# Patient Record
Sex: Female | Born: 1975
Health system: Southern US, Community
[De-identification: ages and names within clinical notes are randomized; demographics above are authoritative.]

## PROBLEM LIST (undated history)

## (undated) DIAGNOSIS — E785 Hyperlipidemia, unspecified: Secondary | ICD-10-CM

## (undated) DIAGNOSIS — Z87442 Personal history of urinary calculi: Secondary | ICD-10-CM

## (undated) DIAGNOSIS — M7710 Lateral epicondylitis, unspecified elbow: Secondary | ICD-10-CM

## (undated) DIAGNOSIS — F419 Anxiety disorder, unspecified: Secondary | ICD-10-CM

## (undated) DIAGNOSIS — T7840XA Allergy, unspecified, initial encounter: Secondary | ICD-10-CM

## (undated) DIAGNOSIS — F329 Major depressive disorder, single episode, unspecified: Secondary | ICD-10-CM

## (undated) DIAGNOSIS — F32A Depression, unspecified: Secondary | ICD-10-CM

## (undated) HISTORY — DX: Major depressive disorder, single episode, unspecified: F32.9

## (undated) HISTORY — PX: KIDNEY STONE SURGERY: SHX686

## (undated) HISTORY — DX: Anxiety disorder, unspecified: F41.9

## (undated) HISTORY — DX: Lateral epicondylitis, unspecified elbow: M77.10

## (undated) HISTORY — DX: Allergy, unspecified, initial encounter: T78.40XA

## (undated) HISTORY — DX: Depression, unspecified: F32.A

## (undated) HISTORY — PX: WISDOM TOOTH EXTRACTION: SHX21

## (undated) HISTORY — DX: Hyperlipidemia, unspecified: E78.5

---

## 2015-06-11 HISTORY — PX: LITHOTRIPSY: SUR834

## 2016-10-30 ENCOUNTER — Ambulatory Visit (INDEPENDENT_AMBULATORY_CARE_PROVIDER_SITE_OTHER): Payer: Self-pay | Admitting: Family Medicine

## 2016-10-30 ENCOUNTER — Encounter: Payer: Self-pay | Admitting: Family Medicine

## 2016-10-30 VITALS — BP 140/75 | HR 110 | Temp 99.4°F | Resp 16 | Ht 62.0 in | Wt 182.2 lb

## 2016-10-30 DIAGNOSIS — J0100 Acute maxillary sinusitis, unspecified: Secondary | ICD-10-CM

## 2016-10-30 MED ORDER — HYDROCODONE-HOMATROPINE 5-1.5 MG/5ML PO SYRP: 5.0000 mL | ORAL_SOLUTION | Freq: Three times a day (TID) | ORAL | 0 refills | Status: DC | PRN

## 2016-10-30 MED ORDER — PREDNISONE 20 MG PO TABS: ORAL_TABLET | ORAL | 0 refills | Status: DC

## 2016-10-30 MED ORDER — AMOXICILLIN 500 MG PO CAPS: 1000.0000 mg | ORAL_CAPSULE | Freq: Two times a day (BID) | ORAL | 0 refills | Status: DC

## 2016-10-30 NOTE — Patient Instructions (Signed)

## 2016-10-30 NOTE — Progress Notes (Signed)
Subjective:  By signing my name below, I, Lori Fisher, attest that this documentation has been prepared under the direction and in the presence of [name], MD Electronically Signed: Ladene Artist, ED Scribe 10/30/2016 at 9:37 AM.   Patient ID: Lori Fisher, female    DOB: Mar 20, 1976, 41 y.o.   MRN: [id number]  Chief Complaint  Patient presents with  . Sinus Problem    nasal congestion, facial pressure  . Cough    Productive, x 2 days   . Ear Pain    Bilateral    HPI Lori Fisher is a 41 y.o. female who presents to Primary Care at [facility] complaining of a sore, scratchy throat onset 5 days ago. Pt reports associated symptoms of fever with Tmax 99 F, chills, nasal congestion, sinus pressure/pain, postnasal drip, pressure in both ears, productive cough with yellow sputum. Pt has tried TheraFlu, Mucinex, Flonase and has increased her water intake. She states that she recently moved her for work; but states that she used to take allergy injections. No antibiotic allergies.   Past Medical History:  Diagnosis Date  . Allergy   . Anxiety   . Depression   . Hyperlipidemia    No current outpatient prescriptions on file prior to visit.   No current facility-administered medications on file prior to visit.    No Known Allergies  Review of Systems  Constitutional: Positive for chills and fever.  HENT: Positive for congestion, ear pain (pressure), postnasal drip, sinus pain, sinus pressure and sore throat.   Respiratory: Positive for cough.   Allergic/Immunologic: Positive for environmental allergies.      Objective:   Physical Exam  Constitutional: She is oriented to person, place, and time. She appears well-developed and well-nourished. No distress.  HENT:  Head: Normocephalic and atraumatic.  Right Ear: A middle ear effusion is present.  Left Ear: A middle ear effusion is present.  Nose: Right sinus exhibits maxillary sinus tenderness. Left sinus exhibits maxillary sinus  tenderness.  Eyes: Conjunctivae and EOM are normal.  Neck: Neck supple. No tracheal deviation present.  Cardiovascular: Regular rhythm, S1 normal, S2 normal and normal heart sounds.  Tachycardia present.   Pulmonary/Chest: Effort normal and breath sounds normal. No respiratory distress.  Musculoskeletal: Normal range of motion.  Lymphadenopathy:       Head (right side): Submandibular adenopathy present.       Head (left side): Submandibular adenopathy present.    She has cervical adenopathy (anterior).  Neurological: She is alert and oriented to person, place, and time.  Skin: Skin is warm. She is diaphoretic.  Psychiatric: She has a normal mood and affect. Her behavior is normal.  Nursing note and vitals reviewed.  BP 140/75   Pulse (!) 110   Temp 99.4 F (37.4 C) (Oral)   Resp 16   Ht 5\' 2"  (1.575 m)   Wt 182 lb 3.2 oz (82.6 kg)   SpO2 97%   BMI 33.32 kg/m     Assessment & Plan:   1. Acute non-recurrent maxillary sinusitis   pt requested out of work note  Meds ordered this encounter  Medications  . sertraline (ZOLOFT) 50 MG tablet    Sig: Take 50 mg by mouth daily.  Marland Kitchen levocetirizine (XYZAL) 5 MG tablet    Sig: Take 5 mg by mouth every evening.  . Probiotic Product (PROBIOTIC-10 PO)    Sig: Take by mouth.  . fluticasone (FLONASE) 50 MCG/ACT nasal spray    Sig: Place into both nostrils  daily.  . amoxicillin (AMOXIL) 500 MG capsule    Sig: Take 2 capsules (1,000 mg total) by mouth 2 (two) times daily.    Dispense:  40 capsule    Refill:  0  . predniSONE (DELTASONE) 20 MG tablet    Sig: 3 tabs x 2d, 2 tabs x 2d, 1 tab x 2d    Dispense:  12 tablet    Refill:  0  . HYDROcodone-homatropine (HYCODAN) 5-1.5 MG/5ML syrup    Sig: Take 5 mLs by mouth every 8 (eight) hours as needed for cough.    Dispense:  100 mL    Refill:  0    I personally performed the services described in this documentation, which was scribed in my presence. The recorded information has been  reviewed and considered, and addended by me as needed.   [name], M.D.  Primary Care at [facility] [address] [place], [place] [address] [phone] phone [phone] fax  11/05/16 1:27 PM

## 2016-10-31 ENCOUNTER — Telehealth: Payer: Self-pay | Admitting: Family Medicine

## 2016-10-31 NOTE — Telephone Encounter (Signed)
Pt was just seen yesterday so would have been received today so no, I have definitely have not seen it. Also, I am out of the office until Thursday 5/31 so it will be a while before I am going to be able to get this completed - I did not know I was going to have do paperwork for her.

## 2016-10-31 NOTE — Telephone Encounter (Signed)
Pt is calling to check and see if we had received her paperwork for temporary leave from work for [employer].  Please advise if you have seen it.  Advised that it could be with [name] or FMLA/disabilities [phone]

## 2016-11-01 NOTE — Telephone Encounter (Signed)
Still not in my box yet.

## 2016-11-05 NOTE — Telephone Encounter (Signed)
Paperwork was in my box I was out of town on vacation, I have completed what I could from the [place] notes and highlighted what needs to be finished I will place the forms in Dr Raul Del box on 11/05/16 please return them to the FMLA/Disability box at the 102 checkout desk within 5-7 business days. Thank you!

## 2016-11-09 ENCOUNTER — Ambulatory Visit (INDEPENDENT_AMBULATORY_CARE_PROVIDER_SITE_OTHER): Payer: 59 | Admitting: Family Medicine

## 2016-11-09 ENCOUNTER — Encounter: Payer: Self-pay | Admitting: Family Medicine

## 2016-11-09 VITALS — BP 123/82 | HR 116 | Temp 98.8°F | Resp 17 | Ht 62.25 in | Wt 179.0 lb

## 2016-11-09 DIAGNOSIS — J3089 Other allergic rhinitis: Secondary | ICD-10-CM | POA: Diagnosis not present

## 2016-11-09 DIAGNOSIS — F411 Generalized anxiety disorder: Secondary | ICD-10-CM

## 2016-11-09 MED ORDER — MONTELUKAST SODIUM 10 MG PO TABS: 10.0000 mg | ORAL_TABLET | Freq: Every day | ORAL | 3 refills | Status: DC

## 2016-11-09 MED ORDER — SERTRALINE HCL 50 MG PO TABS: 50.0000 mg | ORAL_TABLET | Freq: Every day | ORAL | 3 refills | Status: DC

## 2016-11-09 NOTE — Progress Notes (Signed)
By signing my name below, I, Mesha Guinyard, attest that this documentation has been prepared under the direction and in the presence of [name], MD.  Electronically Signed: Verlee Monte, Medical Scribe. 11/09/16. 10:06 AM.  Subjective:    Patient ID: Lori Fisher, female    DOB: [birth year], 41 y.o.   MRN: [id number]  HPI Chief Complaint  Patient presents with  . paperwork completion    Was out of work for a period of time due to sickness after last visit & job is requiring a visit & fom completion  . Medication Refill    Zoloft    HPI Comments: Lori Fisher is a 41 y.o. female who presents to Primary Care at [facility] for [facility] paper work and medication refill. She reports feeling much better after being out from 5/21 to 5/29. Pt went back to work 5/29 and she plans on being back full-time without anticipation of getting sick again. Pt is back on zxyzal and flonase. Pt isn't sure if she should continue zxyzal. Pt started geting bad sinus infections when she stopped getting her allergy injections after moving here - controlled when getting injections in the [place]. When pt was living up [place], she tested a positive allergy to pet dander, and some mold, but doesn't remember being allergic to pollen. Denies needing accommodations going back.  Pt would like to have her paperwork faxed out.  Zoloft Refill: She has been on this same dose for years, and hasn't been on other antidepressants. She has tried to wean herself down to 25 mg but her anxiety, in the form of being always worried, returned. She would worried about life, and work, but noting traumatic.  There are no active problems to display for this patient.  Past Medical History:  Diagnosis Date  . Allergy   . Anxiety   . Depression   . Hyperlipidemia    No past surgical history on file. No Known Allergies Prior to Admission medications   Medication Sig Start Date End Date Taking? Authorizing Provider  fluticasone (FLONASE) 50  MCG/ACT nasal spray Place into both nostrils daily.   Yes [provider]  levocetirizine (XYZAL) 5 MG tablet Take 5 mg by mouth every evening.   Yes [provider]  Probiotic Product (PROBIOTIC-10 PO) Take by mouth.   Yes [provider]  sertraline (ZOLOFT) 50 MG tablet Take 50 mg by mouth daily.   Yes [provider]   Social History   Social History  . Marital status: Married    Spouse name: N/A  . Number of children: N/A  . Years of education: N/A   Occupational History  . Not on file.   Social History Main Topics  . Smoking status: Never Smoker  . Smokeless tobacco: Never Used  . Alcohol use 0.6 oz/week    1 Glasses of wine per week  . Drug use: No  . Sexual activity: Not on file   Other Topics Concern  . Not on file   Social History Narrative  . No narrative on file   Review of Systems  Constitutional: Negative for fever.  HENT: Negative for congestion, sinus pain, sinus pressure and sore throat.   Respiratory: Negative for cough.   Allergic/Immunologic: Positive for environmental allergies.  Psychiatric/Behavioral: The patient is nervous/anxious (controlled with zoloft).     Objective:  Physical Exam  Constitutional: She appears well-developed and well-nourished. No distress.  HENT:  Head: Normocephalic and atraumatic.  Eyes: Conjunctivae are normal.  Neck:  Neck supple.  Cardiovascular: Normal rate.   Pulmonary/Chest: Effort normal.  Neurological: She is alert.  Skin: Skin is warm and dry.  Psychiatric: She has a normal mood and affect. Her behavior is normal.  Nursing note and vitals reviewed.   Vitals:   11/09/16 0941  BP: 123/82  Pulse: (!) 116  Resp: 17  Temp: 98.8 F (37.1 C)  TempSrc: Oral  SpO2: 95%  Weight: 179 lb (81.2 kg)  Height: 5' 2.25" (1.581 m)  Body mass index is 32.48 kg/m.  Assessment & Plan:   1. Environmental and seasonal allergies   2. Generalized anxiety disorder     Orders Placed  This Encounter  Procedures  . Ambulatory referral to Allergy    Referral Priority:   Routine    Referral Type:   Allergy Testing    Referral Reason:   Specialty Services Required    Requested Specialty:   Allergy    Number of Visits Requested:   1    Meds ordered this encounter  Medications  . sertraline (ZOLOFT) 50 MG tablet    Sig: Take 1 tablet (50 mg total) by mouth daily.    Dispense:  90 tablet    Refill:  3  . montelukast (SINGULAIR) 10 MG tablet    Sig: Take 1 tablet (10 mg total) by mouth at bedtime.    Dispense:  30 tablet    Refill:  3    I personally performed the services described in this documentation, which was scribed in my presence. The recorded information has been reviewed and considered, and addended by me as needed.   [name], M.D.  Primary Care at [facility] [address] [place], [place] [address] [phone] phone [phone] fax  11/11/16 11:02 PM

## 2016-11-09 NOTE — Patient Instructions (Signed)
     IF you received an x-ray today, you will receive an invoice from [facility]. Please contact [facility] at [phone] with questions or concerns regarding your invoice.   IF you received labwork today, you will receive an invoice from [facility]. Please contact [facility] at [phone] with questions or concerns regarding your invoice.   Our billing staff will not be able to assist you with questions regarding bills from these companies.  You will be contacted with the lab results as soon as they are available. The fastest way to get your results is to activate your My Chart account. Instructions are located on the last page of this paperwork. If you have not heard from us regarding the results in 2 weeks, please contact this office.     

## 2016-11-09 NOTE — Telephone Encounter (Signed)
Done today during OV. Copies returned to pt in office with originals faxed and to [facility] box. Pt had two more forms about the same thing for completion that are with it and done. Do not need to charge pt for forms since she came in for an OV just to have these done so can use the OV bill to cover it.  Thanks.

## 2016-11-11 MED FILL — SERTRALINE HCL 50 MG TABLET: 50 | 90 days supply | Qty: 90 | Fill #0

## 2016-11-12 NOTE — Telephone Encounter (Signed)
Paperwork scanned and faxed on 11/12/16

## 2017-01-07 MED FILL — MONTELUKAST SOD 10 MG TAB: 10 | 30 days supply | Qty: 30 | Fill #0

## 2017-02-03 MED FILL — MONTELUKAST SOD 10 MG TAB: 10 | 30 days supply | Qty: 30 | Fill #1

## 2017-02-24 MED FILL — SERTRALINE HCL 50 MG TABLET: 50 | 90 days supply | Qty: 90 | Fill #1

## 2017-03-07 ENCOUNTER — Other Ambulatory Visit: Payer: Self-pay | Admitting: Family Medicine

## 2017-03-07 MED FILL — MONTELUKAST SOD 10 MG TAB: 10 | 30 days supply | Qty: 30 | Fill #0

## 2017-03-14 DIAGNOSIS — Z6832 Body mass index (BMI) 32.0-32.9, adult: Secondary | ICD-10-CM

## 2017-03-14 DIAGNOSIS — F411 Generalized anxiety disorder: Secondary | ICD-10-CM | POA: Insufficient documentation

## 2017-03-14 DIAGNOSIS — J3089 Other allergic rhinitis: Secondary | ICD-10-CM | POA: Insufficient documentation

## 2017-03-14 DIAGNOSIS — E6609 Other obesity due to excess calories: Secondary | ICD-10-CM | POA: Insufficient documentation

## 2017-03-14 DIAGNOSIS — E7800 Pure hypercholesterolemia, unspecified: Secondary | ICD-10-CM | POA: Insufficient documentation

## 2017-03-14 NOTE — Progress Notes (Deleted)
   Subjective:    Patient ID: Margot Oriordan, female    DOB: May 13, 1976, 41 y.o.   MRN: [id number]  HPI  Primary Preventative Screenings: Cervical Cancer:  Family Planning: STI screening:  Breast Cancer:  Colorectal Cancer: Tobacco use/EtOH/substances: Bone Density: Cardiac: Weight/Blood sugar/Diet/Exercise: OTC/Vit/Supp/Herbal:probiotic Dentist/Optho: Immunizations:  UTD as just started working for [employer] so had to confirm.  Chronic Medical Conditions: Environmental allergies: flonase, xyzal, singulair. Prev was on allergy injections (was living in [place]). +pet dander, mold. Depression/anxiety: zoloft 50 - stable on same dose for years. No prior trials of other meds but sxs return when tries to wean off.  Past Medical History:  Diagnosis Date  . Allergy   . Anxiety   . Depression   . Hyperlipidemia    No past surgical history on file. Current Outpatient Prescriptions on File Prior to Visit  Medication Sig Dispense Refill  . fluticasone (FLONASE) 50 MCG/ACT nasal spray Place into both nostrils daily.    Marland Kitchen levocetirizine (XYZAL) 5 MG tablet Take 5 mg by mouth every evening.    . montelukast (SINGULAIR) 10 MG tablet TAKE 1 TABLET BY MOUTH ONCE DAILY AT BEDTIME 30 tablet 2  . Probiotic Product (PROBIOTIC-10 PO) Take by mouth.    . sertraline (ZOLOFT) 50 MG tablet Take 1 tablet (50 mg total) by mouth daily. 90 tablet 3   No current facility-administered medications on file prior to visit.    No Known Allergies Family History  Problem Relation Age of Onset  . Cancer Father   . Diabetes Sister    Social History   Social History  . Marital status: Married    Spouse name: N/A  . Number of children: N/A  . Years of education: N/A   Social History Main Topics  . Smoking status: Never Smoker  . Smokeless tobacco: Never Used  . Alcohol use 0.6 oz/week    1 Glasses of wine per week  . Drug use: No  . Sexual activity: Not on file   Other Topics Concern  . Not on file    Social History Narrative  . No narrative on file    Review of Systems     Objective:   Physical Exam        Assessment & Plan:  hiv? Pap? Flu? Tdap? TACHYCARDIC AGAIN??? -> EKG

## 2017-03-15 ENCOUNTER — Ambulatory Visit (INDEPENDENT_AMBULATORY_CARE_PROVIDER_SITE_OTHER): Payer: 59 | Admitting: Family Medicine

## 2017-03-15 ENCOUNTER — Ambulatory Visit (INDEPENDENT_AMBULATORY_CARE_PROVIDER_SITE_OTHER): Payer: 59

## 2017-03-15 ENCOUNTER — Encounter: Payer: Self-pay | Admitting: Family Medicine

## 2017-03-15 VITALS — BP 114/82 | HR 88 | Temp 98.7°F | Resp 16

## 2017-03-15 DIAGNOSIS — E6609 Other obesity due to excess calories: Secondary | ICD-10-CM | POA: Diagnosis not present

## 2017-03-15 DIAGNOSIS — Z1389 Encounter for screening for other disorder: Secondary | ICD-10-CM

## 2017-03-15 DIAGNOSIS — F411 Generalized anxiety disorder: Secondary | ICD-10-CM | POA: Diagnosis not present

## 2017-03-15 DIAGNOSIS — D249 Benign neoplasm of unspecified breast: Secondary | ICD-10-CM | POA: Diagnosis not present

## 2017-03-15 DIAGNOSIS — Z136 Encounter for screening for cardiovascular disorders: Secondary | ICD-10-CM

## 2017-03-15 DIAGNOSIS — Z1383 Encounter for screening for respiratory disorder NEC: Secondary | ICD-10-CM

## 2017-03-15 DIAGNOSIS — Z1231 Encounter for screening mammogram for malignant neoplasm of breast: Secondary | ICD-10-CM

## 2017-03-15 DIAGNOSIS — M79672 Pain in left foot: Secondary | ICD-10-CM

## 2017-03-15 DIAGNOSIS — Z124 Encounter for screening for malignant neoplasm of cervix: Secondary | ICD-10-CM

## 2017-03-15 DIAGNOSIS — E7800 Pure hypercholesterolemia, unspecified: Secondary | ICD-10-CM | POA: Diagnosis not present

## 2017-03-15 DIAGNOSIS — J3089 Other allergic rhinitis: Secondary | ICD-10-CM

## 2017-03-15 DIAGNOSIS — Z130 Encounter for screening for diseases of the blood and blood-forming organs and certain disorders involving the immune mechanism: Secondary | ICD-10-CM | POA: Diagnosis not present

## 2017-03-15 DIAGNOSIS — Z6832 Body mass index (BMI) 32.0-32.9, adult: Secondary | ICD-10-CM

## 2017-03-15 DIAGNOSIS — Z113 Encounter for screening for infections with a predominantly sexual mode of transmission: Secondary | ICD-10-CM | POA: Diagnosis not present

## 2017-03-15 DIAGNOSIS — Z1329 Encounter for screening for other suspected endocrine disorder: Secondary | ICD-10-CM

## 2017-03-15 DIAGNOSIS — Z0000 Encounter for general adult medical examination without abnormal findings: Secondary | ICD-10-CM

## 2017-03-15 LAB — POCT URINALYSIS DIP (MANUAL ENTRY)
Bilirubin, UA: NEGATIVE
Glucose, UA: NEGATIVE mg/dL
Ketones, POC UA: NEGATIVE mg/dL
Leukocytes, UA: NEGATIVE
Nitrite, UA: NEGATIVE
Protein Ur, POC: NEGATIVE mg/dL
Spec Grav, UA: 1.025 (ref 1.010–1.025)
Urobilinogen, UA: 0.2 E.U./dL
pH, UA: 6.5 (ref 5.0–8.0)

## 2017-03-15 MED ORDER — SERTRALINE HCL 100 MG PO TABS: 100.0000 mg | ORAL_TABLET | Freq: Every day | ORAL | 3 refills | Status: DC

## 2017-03-15 NOTE — Progress Notes (Signed)
Subjective:  By signing my name below, I, Lori Fisher, attest that this documentation has been prepared under the direction and in the presence of Lori Cheadle, MD. Electronically Signed: Moises Fisher, [place]. 03/15/2017 , 9:45 AM .  Patient was seen in Room 3 .   Patient ID: Lori Fisher, female    DOB: 11-15-1975, 41 y.o.   MRN: [id number] Chief Complaint  Patient presents with  . Annual Exam   HPI Patient was previously living in [place]. She is fasting today.   Right heel pain: She complains noticed right heel pain since moving down here over past 6 months, more painful after being on her feet at the end of work day. Sometimes, she notices the discomfort first thing in the morning, but has relief after massaging the area. She's been wearing supportive sneakers and shoes. She hasn't tried applying ice or heat to the area.   Primary Preventative Screenings: Cervical Cancer: April 2017, prior to moving down to [place]. No history of abnormal pap smears. HPV testing was negative. Always have had abnormal periods due to fertility testing years ago, but no changes recently.  STI screening: no concerns for screening.  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  Colorectal Cancer: Father was diagnosed with colorectal cancer at around 42 years old. Patient has had digestive issues for several years with constipation and loose stools since around 3 years old. Both father and sister have had history of diverticulosis. She's had used imodium in the past when it gets much worse, but not often. She denies using stool softener in the past. She denies Fisher or dark, tarry stools. She mentioned increase in hemorrhoids lately, but not too bad. She hasn't taken consistent fiber supplements.  Tobacco use/EtOH/substances: No history of tobacco use or alcohol.  Weight/Fisher sugar/Diet/Exercise: Not much regular  exercise recently as she's still trying to get settled. In the past, she did Zumba a lot. She tries to do yoga at home.  OTC/Vit/Supp/Herbal: probiotic; no other supplements. She has lactate with cereal every few days.  Dentist/Optho: hasn't established dentist and eye doctor yet.  Immunizations:  UTD as just started working for [employer] so had to confirm.  HIV screening: she agrees to screening Fisher work today.   Chronic Medical Conditions: Environmental allergies: flonase, xyzal, singulair. Prev was on allergy injections (was living in [place]). +pet dander, mold. She has an appointment with [facility] on Wednesday.   Depression/anxiety: zoloft 50mg  - stable on same dose for years. No prior trials of other meds but sxs return when tries to wean off. She's been up to Zoloft 100mg  in the past without any side effects. She's had some anxiety with the stress of adjusting.   Past Medical History:  Diagnosis Date  . Allergy   . Anxiety   . Depression   . Hyperlipidemia    History reviewed. No pertinent surgical history. Current Outpatient Prescriptions on File Prior to Visit  Medication Sig Dispense Refill  . montelukast (SINGULAIR) 10 MG tablet TAKE 1 TABLET BY MOUTH ONCE DAILY AT BEDTIME 30 tablet 2  . Probiotic Product (PROBIOTIC-10 PO) Take by mouth.    . sertraline (ZOLOFT) 50 MG tablet Take 1 tablet (50 mg total) by mouth daily. 90 tablet 3   No current facility-administered medications on file prior to visit.    No Known Allergies Family History  Problem Relation Age of Onset  .  Cancer Father   . Diabetes Sister    Social History   Social History  . Marital status: Married    Spouse name: N/A  . Number of children: N/A  . Years of education: N/A   Social History Main Topics  . Smoking status: Never Smoker  . Smokeless tobacco: Never Used  . Alcohol use 0.6 oz/week    1 Glasses of wine per week  . Drug use: No  . Sexual activity: Not Asked   Other Topics  Concern  . None   Social History Narrative  . None   Depression screen [facility] 2/9 03/15/2017 11/09/2016 10/30/2016  Decreased Interest 0 - 0  Down, Depressed, Hopeless 0 0 0  PHQ - 2 Score 0 0 0    Review of Systems  Constitutional: Negative for chills, fatigue, fever and unexpected weight change.  Respiratory: Negative for cough.   Gastrointestinal: Negative for constipation, diarrhea, nausea and vomiting.  Musculoskeletal: Positive for arthralgias.  Skin: Negative for rash and wound.  Allergic/Immunologic: Positive for environmental allergies.  Neurological: Negative for dizziness, weakness and headaches.  Psychiatric/Behavioral: The patient is nervous/anxious.   All other systems reviewed and are negative.      Objective:   Physical Exam  Constitutional: She is oriented to person, place, and time. She appears well-developed and well-nourished. No distress.  HENT:  Head: Normocephalic and atraumatic.  Right Ear: Tympanic membrane, external ear and ear canal normal.  Left Ear: Tympanic membrane, external ear and ear canal normal.  Nose: Nose normal. No mucosal edema or rhinorrhea.  Mouth/Throat: Uvula is midline, oropharynx is clear and moist and mucous membranes are normal. No posterior oropharyngeal erythema.  Eyes: Pupils are equal, round, and reactive to light. Conjunctivae and EOM are normal. Right eye exhibits no discharge. Left eye exhibits no discharge. No scleral icterus.  Neck: Normal range of motion. Neck supple. No thyromegaly present.  Cardiovascular: Normal rate, regular rhythm, normal heart sounds and intact distal pulses.   Pulmonary/Chest: Effort normal and breath sounds normal. No respiratory distress. She has no decreased breath sounds. She has no wheezes.  Abdominal: Soft. Bowel sounds are normal. There is no tenderness.  Genitourinary: No breast swelling, tenderness, discharge or bleeding.  Musculoskeletal: Normal range of motion. She exhibits no edema.  Right  heel: describes pain just medial center distal to the pad  Lymphadenopathy:    She has no cervical adenopathy.  Neurological: She is alert and oriented to person, place, and time. She has normal reflexes.  Skin: Skin is warm and dry. She is not diaphoretic. No erythema.  Psychiatric: She has a normal mood and affect. Her behavior is normal.  Nursing note and vitals reviewed.   BP 114/82   Pulse 88   Temp 98.7 F (37.1 C) (Oral)   Resp 16   LMP 03/15/2017   SpO2 96%   Dg Os Calcis Left  Result Date: 03/15/2017 CLINICAL DATA:  Distal medial left heel pain EXAM: LEFT OS CALCIS - 2+ VIEW COMPARISON:  None. FINDINGS: No acute fracture or dislocation. No aggressive osseous lesion. No periosteal reaction or bone destruction. Small plantar calcaneal spur. Soft tissues are normal. IMPRESSION: No acute osseous injury of the left calcaneus. Electronically Signed   By: Kathreen Devoid   On: 03/15/2017 10:02       Assessment & Plan:   1. 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  2. Encounter for screening mammogram for breast cancer   3. Screening for cardiovascular, respiratory, and genitourinary diseases   4. Screening for cervical cancer - Pt reports pap UTD and no abnml hx so will likely not need to repeat for 4 years - get prior records.   5. Screening for deficiency anemia   6. Screening for thyroid disorder   7. Routine screening for STI (sexually transmitted infection)   8. Class 1 obesity due to excess calories without serious comorbidity with body mass index (BMI) of 32.0 to 32.9 in adult   9. Environmental and seasonal allergies - appt w/ allergy P.  10. Generalized anxiety disorder - increase zoloft from 50 to 100 - consider decreasing back down in 6 mos.  11. Pure hypercholesterolemia   12. Pain of left heel - try heal cups, xray nml  13. Breast fibroadenoma in female, unspecified  laterality - sched mammogram, make sure breast center can get prior images prior to appt.    Orders Placed This Encounter  Procedures  . MM Digital Screening    Standing Status:   Future    Standing Expiration Date:   05/14/2018    Order Specific Question:   Reason for Exam (SYMPTOM  OR DIAGNOSIS REQUIRED)    Answer:   screening    Order Specific Question:   Is the patient pregnant?    Answer:   No    Order Specific Question:   Preferred imaging location?    Answer:   [facility]  . DG Os Calcis Left    Standing Status:   Future    Number of Occurrences:   1    Standing Expiration Date:   03/15/2018    Order Specific Question:   Reason for Exam (SYMPTOM  OR DIAGNOSIS REQUIRED)    Answer:   pain immed distal and medial to left heal pad worse in a.m. and evening    Order Specific Question:   Is the patient pregnant?    Answer:   No    Order Specific Question:   Preferred imaging location?    Answer:   External  . Comprehensive metabolic panel    Order Specific Question:   Has the patient fasted?    Answer:   Yes  . CBC with Differential/Platelet  . Lipid panel    Order Specific Question:   Has the patient fasted?    Answer:   Yes  . TSH  . Hemoglobin A1c  . HIV antibody  . POCT urinalysis dipstick    Meds ordered this encounter  Medications  . loratadine (CLARITIN) 10 MG tablet    Sig: Take 10 mg by mouth daily.  Marland Kitchen triamcinolone (NASACORT ALLERGY 24HR) 55 MCG/ACT AERO nasal inhaler    Sig: Place 2 sprays into the nose daily.  Marland Kitchen MELATONIN GUMMIES PO    Sig: Take 5 mg by mouth.  . Multiple Vitamin (MULTIVITAMINS PO)    Sig: Take 1 tablet by mouth daily.  . sertraline (ZOLOFT) 100 MG tablet    Sig: Take 1 tablet (100 mg total) by mouth daily.    Dispense:  90 tablet    Refill:  3    I personally performed the services described in this documentation, which was scribed in my presence. The recorded information has been reviewed and considered, and addended by me as  needed.   Lori Fisher, M.D.  Primary Care at [facility] [address] [place], [place] [address] [phone] phone [phone])  [phone] fax  03/18/17 11:21 AM

## 2017-03-15 NOTE — Patient Instructions (Addendum)
You need to schedule your mammogram.  Please call [facility] at [phone] to schedule. Stop by and sign a release at least a month prior - they will definitely need your prior records BEFORE you get the repeat imaging to ensure the correct views are taken.  PLEASE STOP BY THE FRONT DESK AND SIGN A RELEASE SO WE CAN GET YOUR PRIOR RECORDS - IMMUNZATIONS, LAST PAP, MAMMOGRAM REPORTS, ETC FROM YOUR PRIOR PCP.  Calcium Intake Recommendations Calcium is a mineral that affects many functions in the body, including:  Blood clotting.  Blood vessel function.  Nerve impulse conduction.  Hormone secretion.  Muscle contraction.  Bone and teeth functions.  Most of your body's calcium supply is stored in your bones and teeth. When your calcium stores are low, you may be at risk for low bone mass, bone loss, and bone fractures. Consuming enough calcium helps to grow healthy bones and teeth and to prevent breakdown over time. It is very important that you get enough calcium if you are:  A child undergoing rapid growth.  An adolescent girl.  A pre- or post-menopausal woman.  A woman whose menstrual cycle has stopped due to anorexia nervosa or regular intense exercise.  An individual with lactose intolerance or a milk allergy.  A vegetarian.  What is my plan? Try to consume the recommended amount of calcium daily based on your age. Depending on your overall health, your health care provider may recommend increased calcium intake.General daily calcium intake recommendations by age are:  Birth to 6 months: 200 mg.  Infants 7 to 12 months: 260 mg.  Children 1 to 3 years: 700 mg.  Children 4 to 8 years: 1,000 mg.  Children 9 to 13 years: 1,300 mg.  Teens 14 to 18 years: 1,300 mg.  Adults 19 to 50 years: 1,000 mg.  Adult women 51 to 70 years: 1,200 mg.  Adult men 51 to 70 years: 1,000 mg.  Adults 71 years and older: 1,200 mg.  Pregnant and  breastfeeding teens: 1,300 mg.  Pregnant and breastfeeding adults: 1,000 mg.  What do I need to know about calcium intake?  In order for the body to absorb calcium, it needs vitamin D. You can get vitamin D through: ? Direct exposure of the skin to sunlight. ? Foods, such as egg yolks, liver, saltwater fish, and fortified milk. ? Supplements.  Consuming too much calcium may cause: ? Constipation. ? Decreased absorption of iron and zinc. ? Kidney stones.  Calcium supplements may interact with certain medicines. Check with your health care provider before starting any calcium supplements.  Try to get most of your calcium from food. What foods can I eat? Grains  Fortified oatmeal. Fortified ready-to-eat cereals. Fortified frozen waffles. Vegetables Turnip greens. Broccoli. Fruits Fortified orange juice. Meats and Other Protein Sources Canned sardines with bones. Canned salmon with bones. Soy beans. Tofu. Baked beans. Almonds. [place] nuts. Sunflower seeds. Dairy Milk. Yogurt. Cheese. Cottage cheese. Beverages Fortified soy milk. Fortified rice milk. Sweets/Desserts Pudding. Ice Cream. Milkshakes. Blackstrap molasses. The items listed above may not be a complete list of recommended foods or beverages. Contact your dietitian for more options. What foods can affect my calcium intake? It may be more difficult for your body to use calcium or calcium may leave your body more quickly if you consume large amounts of:  Sodium.  Protein.  Caffeine.  Alcohol.  This information is not intended to replace advice given to you by your health  care provider. Make sure you discuss any questions you have with your health care provider. Document Released: 01/09/2004 Document Revised: 12/15/2015 Document Reviewed: 11/02/2013 Elsevier Interactive Patient Education  2018 [place] protect organs, store calcium, and anchor muscles. Good health habits, such as eating  nutritious foods and exercising regularly, are important for maintaining healthy bones. They can also help to prevent a condition that causes bones to lose density and become weak and brittle (osteoporosis). Why is bone mass important? Bone mass refers to the amount of bone tissue that you have. The higher your bone mass, the stronger your bones. An important step toward having healthy bones throughout life is to have strong and dense bones during childhood. A young adult who has a high bone mass is more likely to have a high bone mass later in life. Bone mass at its greatest it is called peak bone mass. A large decline in bone mass occurs in older adults. In women, it occurs about the time of menopause. During this time, it is important to practice good health habits, because if more bone is lost than what is replaced, the bones will become less healthy and more likely to break (fracture). If you find that you have a low bone mass, you may be able to prevent osteoporosis or further bone loss by changing your diet and lifestyle. How can I find out if my bone mass is low? Bone mass can be measured with an X-ray test that is called a bone mineral density (BMD) test. This test is recommended for all women who are age 63 or older. It may also be recommended for men who are age 50 or older, or for people who are more likely to develop osteoporosis due to:  Having bones that break easily.  Having a long-term disease that weakens bones, such as kidney disease or rheumatoid arthritis.  Having menopause earlier than normal.  Taking medicine that weakens bones, such as steroids, thyroid hormones, or hormone treatment for breast cancer or prostate cancer.  Smoking.  Drinking three or more alcoholic drinks each day.  What are the nutritional recommendations for healthy bones? To have healthy bones, you need to get enough of the right minerals and vitamins. Most nutrition experts recommend getting these  nutrients from the foods that you eat. Nutritional recommendations vary from person to person. Ask your health care provider what is healthy for you. Here are some general guidelines. Calcium Recommendations Calcium is the most important (essential) mineral for bone health. Most people can get enough calcium from their diet, but supplements may be recommended for people who are at risk for osteoporosis. Good sources of calcium include:  Dairy products, such as low-fat or nonfat milk, cheese, and yogurt.  Dark green leafy vegetables, such as bok choy and broccoli.  Calcium-fortified foods, such as orange juice, cereal, bread, soy beverages, and tofu products.  Nuts, such as almonds.  Follow these recommended amounts for daily calcium intake:  Children, age 571?3: 700 mg.  Children, age 57?8: 1,000 mg.  Children, age 25?13: 1,300 mg.  Teens, age 67?18: 1,300 mg.  Adults, age 50?50: 1,000 mg.  Adults, age 59?70: ? Men: 1,000 mg. ? Women: 1,200 mg.  Adults, age 29 or older: 1,200 mg.  Pregnant and breastfeeding females: ? Teens: 1,300 mg. ? Adults: 1,000 mg.  Vitamin D Recommendations Vitamin D is the most essential vitamin for bone health. It helps the body to absorb calcium. Sunlight stimulates the skin to  make vitamin D, so be sure to get enough sunlight. If you live in a cold climate or you do not get outside often, your health care provider may recommend that you take vitamin D supplements. Good sources of vitamin D in your diet include:  Egg yolks.  Saltwater fish.  Milk and cereal fortified with vitamin D.  Follow these recommended amounts for daily vitamin D intake:  Children and teens, age 75?18: 22 international units.  Adults, age 75 or younger: 400-800 international units.  Adults, age 67 or older: 800-1,000 international units.  Other Nutrients Other nutrients for bone health include:  Phosphorus. This mineral is found in meat, poultry, dairy foods, nuts,  and legumes. The recommended daily intake for adult men and adult women is 700 mg.  Magnesium. This mineral is found in seeds, nuts, dark green vegetables, and legumes. The recommended daily intake for adult men is 400?420 mg. For adult women, it is 310?320 mg.  Vitamin K. This vitamin is found in green leafy vegetables. The recommended daily intake is 120 mg for adult men and 90 mg for adult women.  What type of physical activity is best for building and maintaining healthy bones? Weight-bearing and strength-building activities are important for building and maintaining peak bone mass. Weight-bearing activities cause muscles and bones to work against gravity. Strength-building activities increases muscle strength that supports bones. Weight-bearing and muscle-building activities include:  Walking and hiking.  Jogging and running.  Dancing.  Gym exercises.  Lifting weights.  Tennis and racquetball.  Climbing stairs.  Aerobics.  Adults should get at least 30 minutes of moderate physical activity on most days. Children should get at least 60 minutes of moderate physical activity on most days. Ask your health care provide what type of exercise is best for you. Where can I find more information? For more information, check out the following websites:  [place]: YardHomes.se  [employer] of Health: http://www.niams.AnonymousEar.fr.asp  This information is not intended to replace advice given to you by your health care provider. Make sure you discuss any questions you have with your health care provider. Document Released: 08/17/2003 Document Revised: 12/15/2015 Document Reviewed: 06/01/2014 Elsevier Interactive Patient Education  2018 [employer].  About Hemorrhoids  Hemorrhoids are swollen veins in the lower rectum and anus.  Also called piles, hemorrhoids are a common problem.  Hemorrhoids  may be internal (inside the rectum) or external (around the anus).  Internal Hemorrhoids  Internal hemorrhoids are often painless, but they rarely cause bleeding.  The internal veins may stretch and fall down (prolapse) through the anus to the outside of the body.  The veins may then become irritated and painful.  External Hemorrhoids  External hemorrhoids can be easily seen or felt around the anal opening.  They are under the skin around the anus.  When the swollen veins are scratched or broken by straining, rubbing or wiping they sometimes bleed.  How Hemorrhoids Occur  Veins in the rectum and around the anus tend to swell under pressure.  Hemorrhoids can result from increased pressure in the veins of your anus or rectum.  Some sources of pressure are:   Straining to have a bowel movement because of constipation  Waiting too long to have a bowel movement  Coughing and sneezing often  Sitting for extended periods of time, including on the toilet  Diarrhea  Obesity  Trauma or injury to the anus  Some liver diseases  Stress  Family history of hemorrhoids  Pregnancy  Pregnant women should try to avoid becoming constipated, because they are more likely to have hemorrhoids during pregnancy.  In the last trimester of pregnancy, the enlarged uterus may press on blood vessels and causes hemorrhoids.  In addition, the strain of childbirth sometimes causes hemorrhoids after the birth.  Symptoms of Hemorrhoids  Some symptoms of hemorrhoids include:  Swelling and/or a tender lump around the anus  Itching, mild burning and bleeding around the anus  Painful bowel movements with or without constipation  Bright red blood covering the stool, on toilet paper or in the toilet bowel.   Symptoms usually go away within a few days.  Always talk to your doctor about any bleeding to make sure it is not from some other causes.  Diagnosing and Treating Hemorrhoids  Diagnosis is made by an  examination by your healthcare provider.  Special test can be performed by your doctor.    Most cases of hemorrhoids can be treated with:  High-fiber diet: Eat more high-fiber foods, which help prevent constipation.  Ask for more detailed fiber information on types and sources of fiber from your healthcare provider.  Fluids: Drink plenty of water.  This helps soften bowel movements so they are easier to pass.  Sitz baths and cold packs: Sitting in lukewarm water two or three times a day for 15 minutes cleases the anal area and may relieve discomfort.  If the water is too hot, swelling around the anus will get worse.  Placing a cloth-covered ice pack on the anus for ten minutes four times a day can also help reduce selling.  Gently pushing a prolapsed hemorrhoid back inside after the bath or ice pack can be helpful.  Medications: For mild discomfort, your healthcare provider may suggest over-the-counter pain medication or prescribe a cream or ointment for topical use.  The cream may contain witch hazel, zinc oxide or petroleum jelly.  Medicated suppositories are also a treatment option.  Always consult your doctor before applying medications or creams.  Procedures and surgeries: There are also a number of procedures and surgeries to shrink or remove hemorrhoids in more serious cases.  Talk to your physician about these options.  You can often prevent hemorrhoids or keep them from becoming worse by maintaining a healthy lifestyle.  Eat a fiber-rich diet of fruits, vegetables and whole grains.  Also, drink plenty of water and exercise regularly.   2007, [employer] Doc.30

## 2017-03-16 LAB — COMPREHENSIVE METABOLIC PANEL
ALT: 15 IU/L (ref 0–32)
AST: 18 IU/L (ref 0–40)
Albumin/Globulin Ratio: 1.9 (ref 1.2–2.2)
Albumin: 4.4 g/dL (ref 3.5–5.5)
Alkaline Phosphatase: 135 IU/L — ABNORMAL HIGH (ref 39–117)
BUN/Creatinine Ratio: 22 (ref 9–23)
BUN: 13 mg/dL (ref 6–24)
Bilirubin Total: 0.4 mg/dL (ref 0.0–1.2)
CO2: 25 mmol/L (ref 20–29)
Calcium: 9.3 mg/dL (ref 8.7–10.2)
Chloride: 99 mmol/L (ref 96–106)
Creatinine, Ser: 0.59 mg/dL (ref 0.57–1.00)
GFR calc Af Amer: 131 mL/min/{1.73_m2} (ref >59)
GFR calc non Af Amer: 113 mL/min/{1.73_m2} (ref >59)
Globulin, Total: 2.3 g/dL (ref 1.5–4.5)
Glucose: 86 mg/dL (ref 65–99)
Potassium: 4.1 mmol/L (ref 3.5–5.2)
Sodium: 139 mmol/L (ref 134–144)
Total Protein: 6.7 g/dL (ref 6.0–8.5)

## 2017-03-16 LAB — CBC WITH DIFFERENTIAL/PLATELET
Basophils Absolute: 0 10*3/uL (ref 0.0–0.2)
Basos: 0 %
EOS (ABSOLUTE): 0.1 10*3/uL (ref 0.0–0.4)
Eos: 1 %
Hematocrit: 40.4 % (ref 34.0–46.6)
Hemoglobin: 14 g/dL (ref 11.1–15.9)
Immature Grans (Abs): 0 10*3/uL (ref 0.0–0.1)
Immature Granulocytes: 0 %
Lymphocytes Absolute: 3.7 10*3/uL — ABNORMAL HIGH (ref 0.7–3.1)
Lymphs: 46 %
MCH: 30.8 pg (ref 26.6–33.0)
MCHC: 34.7 g/dL (ref 31.5–35.7)
MCV: 89 fL (ref 79–97)
Monocytes Absolute: 0.4 10*3/uL (ref 0.1–0.9)
Monocytes: 5 %
Neutrophils Absolute: 3.9 10*3/uL (ref 1.4–7.0)
Neutrophils: 48 %
Platelets: 250 10*3/uL (ref 150–379)
RBC: 4.54 x10E6/uL (ref 3.77–5.28)
RDW: 13.4 % (ref 12.3–15.4)
WBC: 8.2 10*3/uL (ref 3.4–10.8)

## 2017-03-16 LAB — HEMOGLOBIN A1C
Est. average glucose Bld gHb Est-mCnc: 97 mg/dL
Hgb A1c MFr Bld: 5 % (ref 4.8–5.6)

## 2017-03-16 LAB — LIPID PANEL
Chol/HDL Ratio: 5.7 ratio — ABNORMAL HIGH (ref 0.0–4.4)
Cholesterol, Total: 251 mg/dL — ABNORMAL HIGH (ref 100–199)
HDL: 44 mg/dL (ref >39)
LDL Calculated: 160 mg/dL — ABNORMAL HIGH (ref 0–99)
Triglycerides: 235 mg/dL — ABNORMAL HIGH (ref 0–149)
VLDL Cholesterol Cal: 47 mg/dL — ABNORMAL HIGH (ref 5–40)

## 2017-03-16 LAB — TSH: TSH: 2.39 u[IU]/mL (ref 0.450–4.500)

## 2017-03-16 LAB — HIV ANTIBODY (ROUTINE TESTING W REFLEX): HIV Screen 4th Generation wRfx: NONREACTIVE

## 2017-03-17 MED FILL — SERTRALINE HCL 100 MG TAB: 100 | 90 days supply | Qty: 90 | Fill #0

## 2017-03-19 DIAGNOSIS — H1045 Other chronic allergic conjunctivitis: Secondary | ICD-10-CM | POA: Diagnosis not present

## 2017-03-19 DIAGNOSIS — J3089 Other allergic rhinitis: Secondary | ICD-10-CM | POA: Diagnosis not present

## 2017-03-19 DIAGNOSIS — J309 Allergic rhinitis, unspecified: Secondary | ICD-10-CM | POA: Diagnosis not present

## 2017-03-20 MED FILL — LEVOCETIRIZINE 5 MG TABLET: 5 | 30 days supply | Qty: 30 | Fill #0

## 2017-03-20 MED FILL — AZELASTINE HCL 0.05% DROPS: 0.05 | 30 days supply | Qty: 6 | Fill #0

## 2017-04-01 MED FILL — FLUTICASONE PROP 50 MCG SPR: 50 | 30 days supply | Qty: 16 | Fill #0

## 2017-04-01 MED FILL — AZELASTINE 0.1% (137 MCG) S: 0.1 | 30 days supply | Qty: 30 | Fill #0

## 2017-04-11 MED FILL — MONTELUKAST SOD 10 MG TAB: 10 | 30 days supply | Qty: 30 | Fill #1

## 2017-04-18 MED FILL — LEVOCETIRIZINE 5 MG TABLET: 5 | 30 days supply | Qty: 30 | Fill #1

## 2017-05-12 MED FILL — MONTELUKAST SOD 10 MG TAB: 10 | 30 days supply | Qty: 30 | Fill #2

## 2017-05-22 MED FILL — LEVOCETIRIZINE 5 MG TABLET: 5 | 30 days supply | Qty: 30 | Fill #2

## 2017-06-09 ENCOUNTER — Other Ambulatory Visit: Payer: Self-pay | Admitting: Family Medicine

## 2017-06-09 MED FILL — MONTELUKAST SOD 10 MG TAB: 10 | 30 days supply | Qty: 30 | Fill #0

## 2017-06-16 MED FILL — LEVOCETIRIZINE 5 MG TABLET: 5 | 30 days supply | Qty: 30 | Fill #3

## 2017-07-14 MED FILL — FLUTICASONE PROP 50 MCG SPR: 50 | 30 days supply | Qty: 16 | Fill #1

## 2017-07-14 MED FILL — MONTELUKAST SOD 10 MG TAB: 10 | 30 days supply | Qty: 30 | Fill #1

## 2017-07-14 MED FILL — SERTRALINE HCL 100 MG TAB: 100 | 90 days supply | Qty: 90 | Fill #1

## 2017-07-14 MED FILL — LEVOCETIRIZINE 5 MG TABLET: 5 | 30 days supply | Qty: 30 | Fill #0

## 2017-08-11 MED FILL — LEVOCETIRIZINE 5 MG TABLET: 5 | 30 days supply | Qty: 30 | Fill #1

## 2017-08-11 MED FILL — AZELASTINE 0.1% (137 MCG) S: 0.1 | 30 days supply | Qty: 30 | Fill #1

## 2017-08-11 MED FILL — MONTELUKAST SOD 10 MG TAB: 10 | 30 days supply | Qty: 30 | Fill #2

## 2017-09-15 ENCOUNTER — Other Ambulatory Visit: Payer: Self-pay | Admitting: Family Medicine

## 2017-09-15 MED FILL — MONTELUKAST SOD 10 MG TAB: 10 | 30 days supply | Qty: 30 | Fill #0

## 2017-09-15 MED FILL — LEVOCETIRIZINE 5 MG TABLET: 5 | 30 days supply | Qty: 30 | Fill #2

## 2017-10-06 ENCOUNTER — Ambulatory Visit (INDEPENDENT_AMBULATORY_CARE_PROVIDER_SITE_OTHER): Payer: Self-pay | Admitting: Nurse Practitioner

## 2017-10-06 VITALS — BP 122/80 | HR 97 | Temp 99.4°F | Resp 19 | Wt 184.6 lb

## 2017-10-06 DIAGNOSIS — J0100 Acute maxillary sinusitis, unspecified: Secondary | ICD-10-CM

## 2017-10-06 MED ORDER — FLUTICASONE PROPIONATE 50 MCG/ACT NA SUSP: 2.0000 | Freq: Every day | NASAL | 0 refills | Status: DC

## 2017-10-06 MED ORDER — PSEUDOEPH-BROMPHEN-DM 30-2-10 MG/5ML PO SYRP: 5.0000 mL | ORAL_SOLUTION | Freq: Four times a day (QID) | ORAL | 0 refills | Status: AC | PRN

## 2017-10-06 MED ORDER — AMOXICILLIN-POT CLAVULANATE 875-125 MG PO TABS: 1.0000 | ORAL_TABLET | Freq: Two times a day (BID) | ORAL | 0 refills | Status: AC

## 2017-10-06 NOTE — Progress Notes (Signed)
Subjective:  Lori Fisher is a 42 y.o. female who presents for evaluation of possible sinusitis.  Symptoms include facial pain, headache described as aching, nasal congestion, no fever, post nasal drip, productive cough with yellowish-green colored sputum, sinus pressure, sinus pain, sneezing and sore throat.  Onset of symptoms was 2 weeks ago, and has been gradually worsening since that time.  Treatment to date:  antihistamines and nasal steroids.  High risk factors for influenza complications:  none.  The following portions of the patient's history were reviewed and updated as appropriate:  allergies, current medications and past medical history.  Constitutional: positive for anorexia and fatigue, negative for chills, fevers and sweats Eyes: negative Ears, nose, mouth, throat, and face: positive for nasal congestion, sore throat and bilateral ear pressure/fullness, negative for ear drainage, earaches and hoarseness Respiratory: positive for cough, sputum and wheezing, negative for asthma, chronic bronchitis, dyspnea on exertion, pneumonia and stridor Cardiovascular: negative Gastrointestinal: positive for decreased appetite, negative for abdominal pain, constipation, diarrhea, nausea and vomiting Neurological: positive for headaches, negative for coordination problems, dizziness, paresthesia, seizures, vertigo and weakness Allergic/Immunologic: positive for hay fever, negative for anaphylaxis, angioedema and urticaria Objective:  BP 122/80 (BP Location: Right Arm, Patient Position: Sitting, Cuff Size: Normal)   Pulse 97   Temp 99.4 F (37.4 C) (Oral)   Resp 19   Wt 184 lb 9.6 oz (83.7 kg)   SpO2 97%   BMI 33.49 kg/m  General appearance: alert, cooperative, fatigued and no distress Head: Normocephalic, without obvious abnormality, atraumatic Eyes: conjunctivae/corneas clear. PERRL, EOM's intact. Fundi benign. Ears: normal TM's and external ear canals both ears Nose: no discharge,  turbinates swollen, inflamed, moderate maxillary sinus tenderness bilateral, no frontal sinus tenderness bilateral Throat: lips, mucosa, and tongue normal; teeth and gums normal Lungs: clear to auscultation bilaterally Heart: regular rate and rhythm, S1, S2 normal, no murmur, click, rub or gallop Abdomen: soft, non-tender; bowel sounds normal; no masses,  no organomegaly Pulses: 2+ and symmetric Skin: Skin color, texture, turgor normal. No rashes or lesions Lymph nodes: cervical and submandibular nodes normal Neurologic: Grossly normal    Assessment:  Acute Maxillary Sinusitis    Plan:  Discussed diagnosis and treatment of sinusitis. Educational material distributed and questions answered. Supportive care with appropriate antipyretics and fluids. Nasal saline spray for congestion. Augmentin per orders. Nasal steroids per orders. Follow up as needed.  Meds ordered this encounter  Medications  . amoxicillin-clavulanate (AUGMENTIN) 875-125 MG tablet    Sig: Take 1 tablet by mouth 2 (two) times daily for 10 days.    Dispense:  20 tablet    Refill:  0    Order Specific Question:   Supervising Provider    Answer:   Ricard Dillon [[id number]]  . brompheniramine-pseudoephedrine-DM 30-2-10 MG/5ML syrup    Sig: Take 5 mLs by mouth 4 (four) times daily as needed for up to 7 days.    Dispense:  150 mL    Refill:  0    Order Specific Question:   Supervising Provider    Answer:   Ricard Dillon [[id number]]  . fluticasone (FLONASE) 50 MCG/ACT nasal spray    Sig: Place 2 sprays into both nostrils daily for 10 days.    Dispense:  16 g    Refill:  0    Order Specific Question:   Supervising Provider    Answer:   Ricard Dillon [phone]

## 2017-10-06 NOTE — Patient Instructions (Signed)

## 2017-10-09 ENCOUNTER — Telehealth: Payer: Self-pay

## 2017-10-09 NOTE — Telephone Encounter (Signed)
Everytime I called it was busy.

## 2017-10-13 MED FILL — MONTELUKAST SOD 10 MG TAB: 10 | 30 days supply | Qty: 30 | Fill #1

## 2017-10-13 MED FILL — SERTRALINE HCL 100 MG TAB: 100 | 90 days supply | Qty: 90 | Fill #2

## 2017-10-13 MED FILL — LEVOCETIRIZINE 5 MG TABLET: 5 | 30 days supply | Qty: 30 | Fill #0

## 2017-10-27 MED FILL — FLUTICASONE PROP 50 MCG SPR: 50 | 30 days supply | Qty: 16 | Fill #2

## 2017-11-10 MED FILL — MONTELUKAST SOD 10 MG TAB: 10 | 30 days supply | Qty: 30 | Fill #2

## 2017-11-10 MED FILL — LEVOCETIRIZINE 5 MG TABLET: 5 | 30 days supply | Qty: 30 | Fill #1

## 2017-12-10 ENCOUNTER — Other Ambulatory Visit: Payer: Self-pay | Admitting: Family Medicine

## 2017-12-10 MED FILL — LEVOCETIRIZINE 5 MG TABLET: 5 | 30 days supply | Qty: 30 | Fill #2

## 2017-12-10 MED FILL — MONTELUKAST SOD 10 MG TAB: 10 | 30 days supply | Qty: 30 | Fill #0

## 2018-01-15 MED FILL — MONTELUKAST SOD 10 MG TAB: 10 | 30 days supply | Qty: 30 | Fill #1

## 2018-01-15 MED FILL — SERTRALINE HCL 100 MG TAB: 100 | 90 days supply | Qty: 90 | Fill #3

## 2018-01-15 MED FILL — FLUTICASONE PROP 50 MCG SPR: 50 | 30 days supply | Qty: 16 | Fill #3

## 2018-01-15 MED FILL — LEVOCETIRIZINE 5 MG TABLET: 5 | 30 days supply | Qty: 30 | Fill #0

## 2018-02-16 MED FILL — MONTELUKAST SOD 10 MG TAB: 10 | 30 days supply | Qty: 30 | Fill #2

## 2018-02-16 MED FILL — LEVOCETIRIZINE 5 MG TABLET: 5 | 30 days supply | Qty: 30 | Fill #1

## 2018-03-11 DIAGNOSIS — H1045 Other chronic allergic conjunctivitis: Secondary | ICD-10-CM | POA: Diagnosis not present

## 2018-03-11 DIAGNOSIS — J3089 Other allergic rhinitis: Secondary | ICD-10-CM | POA: Diagnosis not present

## 2018-03-16 ENCOUNTER — Other Ambulatory Visit: Payer: Self-pay | Admitting: Family Medicine

## 2018-03-16 MED FILL — LEVOCETIRIZINE 5 MG TABLET: 5 | 30 days supply | Qty: 30 | Fill #0

## 2018-03-16 MED FILL — AZELASTINE HCL 137 MCG/SPRA: 137 | 30 days supply | Qty: 30 | Fill #2

## 2018-03-17 MED FILL — MONTELUKAST SOD 10 MG TAB: 10 | 30 days supply | Qty: 30 | Fill #0

## 2018-03-17 NOTE — Telephone Encounter (Signed)
Attempted to contact pt regarding refill request; last office visit 03/15/17; no upcoming visits noted; left message on voicemail [phone]; when pt calls back please schedule office visit

## 2018-03-28 ENCOUNTER — Other Ambulatory Visit: Payer: Self-pay

## 2018-03-28 ENCOUNTER — Ambulatory Visit: Payer: 59 | Admitting: Family Medicine

## 2018-03-28 VITALS — BP 115/79 | HR 87 | Temp 98.6°F | Ht 63.0 in | Wt 191.2 lb

## 2018-03-28 DIAGNOSIS — J302 Other seasonal allergic rhinitis: Secondary | ICD-10-CM | POA: Diagnosis not present

## 2018-03-28 MED ORDER — MONTELUKAST SODIUM 10 MG PO TABS: 10.0000 mg | ORAL_TABLET | Freq: Every day | ORAL | 1 refills | Status: DC

## 2018-03-28 NOTE — Patient Instructions (Signed)
° ° ° °  If you have lab work done today you will be contacted with your lab results within the next 2 weeks.  If you have not heard from us then please contact us. The fastest way to get your results is to register for My Chart. ° ° °IF you received an x-ray today, you will receive an invoice from [facility]. Please contact [facility] at [phone] with questions or concerns regarding your invoice.  ° °IF you received labwork today, you will receive an invoice from [facility]. Please contact [facility] at [phone] with questions or concerns regarding your invoice.  ° °Our billing staff will not be able to assist you with questions regarding bills from these companies. ° °You will be contacted with the lab results as soon as they are available. The fastest way to get your results is to activate your My Chart account. Instructions are located on the last page of this paperwork. If you have not heard from us regarding the results in 2 weeks, please contact this office. °  ° ° ° °

## 2018-03-28 NOTE — Progress Notes (Signed)
Patient ID: Lori Fisher, female    DOB: 09/26/1975, 42 y.o.   MRN: [id number]  PCP: Shawnee Knapp, MD  Chief Complaint  Patient presents with  . Medication Management    needing refill on singular, requesting 90 day supply    Subjective:  HPI Lori Fisher is a 42 y.o. female presents for medication refill of allergy medication. She is a patient of Dr. [name].   Kierria Feigenbaum has Class 1 obesity due to excess calories without serious comorbidity with body mass index (BMI) of 32.0 to 32.9 in adult; Environmental and seasonal allergies; Generalized anxiety disorder; and Pure hypercholesterolemia on their problem list.   She is followed by [facility] and reports stable allergy management. She is requesting a 90 day supply of Singulair. Reports good control of symptoms. No other complaints today.  Social History   Socioeconomic History  . Marital status: Married    Spouse name: Not on file  . Number of children: Not on file  . Years of education: Not on file  . Highest education level: Not on file  Occupational History  . Not on file  Social Needs  . Financial resource strain: Not on file  . Food insecurity:    Worry: Not on file    Inability: Not on file  . Transportation needs:    Medical: Not on file    Non-medical: Not on file  Tobacco Use  . Smoking status: Never Smoker  . Smokeless tobacco: Never Used  Substance and Sexual Activity  . Alcohol use: Yes    Alcohol/week: 1.0 standard drinks    Types: 1 Glasses of wine per week  . Drug use: No  . Sexual activity: Not on file  Lifestyle  . Physical activity:    Days per week: Not on file    Minutes per session: Not on file  . Stress: Not on file  Relationships  . Social connections:    Talks on phone: Not on file    Gets together: Not on file    Attends religious service: Not on file    Active member of club or organization: Not on file    Attends meetings of clubs or organizations: Not on file     Relationship status: Not on file  . Intimate partner violence:    Fear of current or ex partner: Not on file    Emotionally abused: Not on file    Physically abused: Not on file    Forced sexual activity: Not on file  Other Topics Concern  . Not on file  Social History Narrative  . Not on file    Family History  Problem Relation Age of Onset  . Cancer Father   . Diabetes Sister    Review of Systems  Pertinent negatives listed in HPI  Patient Active Problem List   Diagnosis Date Noted  . Class 1 obesity due to excess calories without serious comorbidity with body mass index (BMI) of 32.0 to 32.9 in adult 03/14/2017  . Environmental and seasonal allergies 03/14/2017  . Generalized anxiety disorder 03/14/2017  . Pure hypercholesterolemia 03/14/2017    No Known Allergies  Prior to Admission medications   Medication Sig Start Date End Date Taking? Authorizing Provider  levocetirizine (XYZAL) 2.5 MG/5ML solution Take 2.5 mg by mouth every evening.   Yes [provider]  loratadine (CLARITIN) 10 MG tablet Take 10 mg by mouth daily.   Yes [provider]  MELATONIN GUMMIES PO Take  5 mg by mouth.   Yes [provider]  montelukast (SINGULAIR) 10 MG tablet TAKE 1 TABLET BY MOUTH AT BEDTIME 03/17/18  Yes Shawnee Knapp, MD  Multiple Vitamin (MULTIVITAMINS PO) Take 1 tablet by mouth daily.   Yes [provider]  Probiotic Product (PROBIOTIC-10 PO) Take by mouth.   Yes [provider]  sertraline (ZOLOFT) 100 MG tablet Take 1 tablet (100 mg total) by mouth daily. 03/15/17  Yes Shawnee Knapp, MD  fluticasone (FLONASE) 50 MCG/ACT nasal spray Place 2 sprays into both nostrils daily for 10 days. 10/06/17 10/16/17  Kara Dies, NP  triamcinolone (NASACORT ALLERGY 24HR) 55 MCG/ACT AERO nasal inhaler Place 2 sprays into the nose daily.    [provider]    Past Medical, Surgical Family and Social History reviewed and updated.     Objective:   Today's Vitals   03/28/18 0947  BP: 115/79  Pulse: 87  Temp: 98.6 F (37 C)  TempSrc: Oral  SpO2: 95%  Weight: 191 lb 3.2 oz (86.7 kg)  Height: 5\' 3"  (1.6 m)    Wt Readings from Last 3 Encounters:  03/28/18 191 lb 3.2 oz (86.7 kg)  10/06/17 184 lb 9.6 oz (83.7 kg)  11/09/16 179 lb (81.2 kg)     Physical Exam General appearance: alert, well developed, well nourished, cooperative and in no distress Head: Normocephalic, without obvious abnormality, atraumatic Respiratory: Respirations even and unlabored, normal respiratory rate Heart: rate and rhythm normal. No gallop or murmurs noted on exam  Extremities: No gross deformities Skin: Skin color, texture, turgor normal. No rashes seen  Psych: Appropriate mood and affect. Neurologic: Mental status: Alert, oriented to person, place, and time, thought content appropriate.  No results found for: POCGLU  Lab Results  Component Value Date   HGBA1C 5.0 03/15/2017     Assessment & Plan:  1. Seasonal allergies, stable -refilled Singulair x 90 days  Meds ordered this encounter  Medications  . montelukast (SINGULAIR) 10 MG tablet    Sig: Take 1 tablet (10 mg total) by mouth at bedtime.    Dispense:  90 tablet    Refill:  1    -The patient was given clear instructions to go to ER or return to medical center if symptoms do not improve, worsen or new problems develop. The patient verbalized understanding.   A total of 15 minutes spent, greater than 50 % of this time was spent counseling and coordination of care.    Carroll Sage. Kenton Kingfisher, FNP-C Nurse Practitioner (PRN Staff)  Primary Care at [facility] [address] [place], [place]

## 2018-04-06 DIAGNOSIS — R1084 Generalized abdominal pain: Secondary | ICD-10-CM | POA: Diagnosis not present

## 2018-04-06 DIAGNOSIS — N200 Calculus of kidney: Secondary | ICD-10-CM | POA: Diagnosis not present

## 2018-04-06 MED FILL — OXYCODONE-ACETAMINOPHEN 5-3: 5-325 | 4 days supply | Qty: 15 | Fill #0

## 2018-04-06 MED FILL — TAMSULOSIN HCL 0.4 MG CAP: 0.4 | 30 days supply | Qty: 30 | Fill #0

## 2018-04-08 ENCOUNTER — Other Ambulatory Visit: Payer: Self-pay | Admitting: Urology

## 2018-04-09 ENCOUNTER — Encounter (HOSPITAL_COMMUNITY): Payer: Self-pay | Admitting: *Deleted

## 2018-04-16 ENCOUNTER — Other Ambulatory Visit: Payer: Self-pay | Admitting: Family Medicine

## 2018-04-16 ENCOUNTER — Encounter (HOSPITAL_COMMUNITY)
Admission: RE | Disposition: A | Discharge status: Home / Self Care | Payer: Self-pay | Source: Ambulatory Visit | Attending: Urology

## 2018-04-16 ENCOUNTER — Emergency Department (HOSPITAL_COMMUNITY)
Admission: EM | Admit: 2018-04-16 | Discharge: 2018-04-17 | Disposition: A | Discharge status: Home / Self Care | Payer: 59 | Source: Home / Self Care | Attending: Emergency Medicine | Admitting: Emergency Medicine

## 2018-04-16 ENCOUNTER — Ambulatory Visit (HOSPITAL_COMMUNITY): Payer: 59

## 2018-04-16 ENCOUNTER — Encounter (HOSPITAL_COMMUNITY): Payer: Self-pay | Admitting: Emergency Medicine

## 2018-04-16 ENCOUNTER — Ambulatory Visit (HOSPITAL_COMMUNITY)
Admission: RE | Admit: 2018-04-16 | Discharge: 2018-04-16 | Disposition: A | Discharge status: Home / Self Care | Payer: 59 | Source: Ambulatory Visit | Attending: Urology | Admitting: Urology

## 2018-04-16 ENCOUNTER — Encounter (HOSPITAL_COMMUNITY): Payer: Self-pay | Admitting: General Practice

## 2018-04-16 ENCOUNTER — Other Ambulatory Visit: Payer: Self-pay

## 2018-04-16 DIAGNOSIS — Z7951 Long term (current) use of inhaled steroids: Secondary | ICD-10-CM | POA: Insufficient documentation

## 2018-04-16 DIAGNOSIS — N200 Calculus of kidney: Secondary | ICD-10-CM | POA: Insufficient documentation

## 2018-04-16 DIAGNOSIS — Z6833 Body mass index (BMI) 33.0-33.9, adult: Secondary | ICD-10-CM | POA: Insufficient documentation

## 2018-04-16 DIAGNOSIS — R319 Hematuria, unspecified: Secondary | ICD-10-CM

## 2018-04-16 DIAGNOSIS — E669 Obesity, unspecified: Secondary | ICD-10-CM | POA: Insufficient documentation

## 2018-04-16 DIAGNOSIS — Z79899 Other long term (current) drug therapy: Secondary | ICD-10-CM | POA: Insufficient documentation

## 2018-04-16 DIAGNOSIS — R109 Unspecified abdominal pain: Secondary | ICD-10-CM

## 2018-04-16 DIAGNOSIS — F419 Anxiety disorder, unspecified: Secondary | ICD-10-CM | POA: Insufficient documentation

## 2018-04-16 DIAGNOSIS — Z87442 Personal history of urinary calculi: Secondary | ICD-10-CM

## 2018-04-16 DIAGNOSIS — F329 Major depressive disorder, single episode, unspecified: Secondary | ICD-10-CM | POA: Insufficient documentation

## 2018-04-16 HISTORY — PX: EXTRACORPOREAL SHOCK WAVE LITHOTRIPSY: SHX1557

## 2018-04-16 HISTORY — DX: Personal history of urinary calculi: Z87.442

## 2018-04-16 LAB — PREGNANCY, URINE: Preg Test, Ur: NEGATIVE

## 2018-04-16 SURGERY — LITHOTRIPSY, ESWL
Anesthesia: LOCAL | Laterality: Left

## 2018-04-16 MED ORDER — SODIUM CHLORIDE 0.9 % IV SOLN
INTRAVENOUS | Status: DC
  Administered 2018-04-16: 06:00:00 via INTRAVENOUS

## 2018-04-16 MED ORDER — DIPHENHYDRAMINE HCL 25 MG PO CAPS
25.0000 mg | ORAL_CAPSULE | ORAL | Status: AC
  Administered 2018-04-16: 25 mg via ORAL
  Filled 2018-04-16: qty 1

## 2018-04-16 MED ORDER — CIPROFLOXACIN HCL 500 MG PO TABS
500.0000 mg | ORAL_TABLET | ORAL | Status: AC
  Administered 2018-04-16: 500 mg via ORAL
  Filled 2018-04-16: qty 1

## 2018-04-16 MED ORDER — DIAZEPAM 5 MG PO TABS
10.0000 mg | ORAL_TABLET | ORAL | Status: AC
  Administered 2018-04-16: 10 mg via ORAL
  Filled 2018-04-16: qty 2

## 2018-04-16 MED FILL — SERTRALINE HCL 100 MG TAB: 100 | 90 days supply | Qty: 90 | Fill #0

## 2018-04-16 MED FILL — LEVOCETIRIZINE 5 MG TABLET: 5 | 30 days supply | Qty: 30 | Fill #1

## 2018-04-16 NOTE — H&P (Signed)
I have pain in the flank.  HPI: Lori Fisher is a 42 year-old female patient who was referred by Dr. Fannie Knee. Lori Pulse, MD who is here for flank pain.  The problem is on the left side. Her pain started about approximately 02/08/2018. The pain is sharp. The pain is intermittent.   Lori Fisher is a 42 yo WF with a history of stones who presents with a 1 month history of left flank pain that is intermittently severe. She has no nausea. She has had possible hematuria and some urinary discomfort. She had a renal US at work and was found to have a 53mm left renal pelvic stone. She had a prior Ca Ox stone treated with ESWL in 2016 in [place] successfullly. She has had UTI's in the past.      ALLERGIES: None   MEDICATIONS: Azelastine Hcl 0.05 % drops  Calcium  Flonase Allergy Relief  Multivitamin  Probiotic  Sertraline Hcl  Singulair  Vitamin D  Xyzal     GU PSH: None   NON-GU PSH: None   GU PMH: None   NON-GU PMH: Anxiety Depression Hypercholesterolemia    FAMILY HISTORY: Blood In Urine - Father Kidney Failure - Father Kidney Stones - Father   SOCIAL HISTORY: Marital Status: Married Preferred Language: English; Race: White Current Smoking Status: Patient has never smoked.   Tobacco Use Assessment Completed: Used Tobacco in last 30 days? Drinks 2 caffeinated drinks per day.    REVIEW OF SYSTEMS:    GU Review Female:   Patient reports burning /pain with urination, leakage of urine, stream starts and stops, and have to strain to urinate. Patient denies frequent urination, hard to postpone urination, get up at night to urinate, trouble starting your stream, and being pregnant.  Gastrointestinal (Upper):   Patient reports indigestion/ heartburn. Patient denies nausea and vomiting.  Gastrointestinal (Lower):   Patient reports diarrhea and constipation.   Constitutional:   Patient reports night sweats. Patient denies fever, weight loss, and fatigue.  Skin:   Patient denies skin rash/ lesion  and itching.  Eyes:   Patient denies blurred vision and double vision.  Ears/ Nose/ Throat:   Patient denies sore throat and sinus problems.  Hematologic/Lymphatic:   Patient denies swollen glands and easy bruising.  Cardiovascular:   Patient denies leg swelling and chest pains.  Respiratory:   Patient denies cough and shortness of breath.  Endocrine:   Patient denies excessive thirst.  Musculoskeletal:   Patient reports back pain. Patient denies joint pain.  Neurological:   Patient denies headaches and dizziness.  Psychologic:   Patient denies depression and anxiety.   VITAL SIGNS:      04/06/2018 01:24 PM  Weight 191 lb / 86.64 kg  Height 63 in / 160.02 cm  BP 121/81 mmHg  Heart Rate 108 /min  Temperature 98.8 F / 37.1 C  BMI 33.8 kg/m   MULTI-SYSTEM PHYSICAL EXAMINATION:    Constitutional: Obese. No physical deformities. Normally developed. Good grooming.   Neck: Neck symmetrical, not swollen. Normal tracheal position.  Respiratory: No labored breathing, no use of accessory muscles. CTA  Cardiovascular: Normal temperature,RRR without murmur  Lymphatic: No enlargement, no tenderness of supraclavicular, neck lymph nodes.  Skin: No paleness, no jaundice, no cyanosis. No lesion, no ulcer, no rash.  Neurologic / Psychiatric: Oriented to time, oriented to place, oriented to person. No depression, no anxiety, no agitation.  Gastrointestinal: Obese abdomen. No mass, no tenderness, no rigidity.   Musculoskeletal: Normal gait and  station of head and neck.     PAST DATA REVIEWED:  Source Of History:  Patient  Urine Test Review:   Urinalysis   PROCEDURES:         KUB - [id number]  A single view of the abdomen is obtained. 16mm Left mid renal stone. pelvic phleboliths. No bone, gas or soft tissue abnormalities.                Urinalysis w/Scope Dipstick Dipstick Cont'd Micro  Color: Yellow Bilirubin: Neg mg/dL WBC/hpf: 0 - 5/hpf  Appearance: Cloudy Ketones: Neg mg/dL RBC/hpf:  >60/hpf  Specific Gravity: 1.020 Blood: 3+ ery/uL Bacteria: Rare (0-9/hpf)  pH: 6.0 Protein: 2+ mg/dL Cystals: NS (Not Seen)  Glucose: Neg mg/dL Urobilinogen: 0.2 mg/dL Casts: NS (Not Seen)    Nitrites: Neg Trichomonas: Not Present    Leukocyte Esterase: Neg leu/uL Mucous: Not Present      Epithelial Cells: 0 - 5/hpf      Yeast: NS (Not Seen)      Sperm: Not Present    ASSESSMENT:      ICD-10 Details  1 GU:   Flank Pain - R10.84 She has a 10x61mm left renal stone with intermittent pain. I reviewed the options and will get her set up for ESWL. I sent percocet and tamsulosin. I reviewed the risks of ESWL including bleeding, infection, injury to the kidney or adjacent structures, failure to fragment the stone, need for ancillary procedures, thrombotic events, cardiac arrhythmias and sedation complications.   2   Renal calculus - N20.0 She will need a metabolic w/u when she is over this episode.    PLAN:            Medications New Meds: Tamsulosin Hcl 0.4 mg capsule 1 capsule PO Daily   #30  1 Refill(s)  Oxycodone-Acetaminophen 5 mg-325 mg tablet 1 tablet PO Q 6 H   #15  0 Refill(s)            Orders X-Rays: [facility]          Schedule Return Visit/Planned Activity: Next Available Appointment - Schedule Surgery             Note: left ESWL

## 2018-04-16 NOTE — Op Note (Signed)
See [facility] OP note scanned into chart. Also because of the size, density, location and other factors that cannot be anticipated I feel this will likely be a staged procedure. This fact supersedes any indication in the scanned [facility] operative note to the contrary.  

## 2018-04-16 NOTE — Telephone Encounter (Signed)
Requested Prescriptions  Pending Prescriptions Disp Refills  . sertraline (ZOLOFT) 100 MG tablet [Pharmacy Med Name: SERTRALINE HCL 100 MG TAB 100 TAB] 90 tablet 0    Sig: TAKE 1 TABLET BY MOUTH ONCE DAILY     Psychiatry:  Antidepressants - SSRI Passed - 04/16/2018  7:34 AM      Passed - Valid encounter within last 6 months    Recent Outpatient Visits          2 weeks ago Seasonal allergies   [facility], [place]   1 year ago Annual physical exam   [facility], Laurey Arrow, [place]   1 year ago Environmental and seasonal allergies   [facility], Laurey Arrow, MD   1 year ago Acute non-recurrent maxillary sinusitis   [facility], Laurey Arrow, MD      Future Appointments            In 1 month [name] Laurey Arrow, MD [facility] [place], [facility]

## 2018-04-16 NOTE — Discharge Instructions (Signed)
See [facility] discharge instructions in chart.  

## 2018-04-16 NOTE — ED Triage Notes (Signed)
Patient c/o left side flank pain after lithotripsy today. Reports taking percocet x2 hours ago with no relief.

## 2018-04-17 ENCOUNTER — Encounter (HOSPITAL_COMMUNITY): Payer: Self-pay | Admitting: Urology

## 2018-04-17 DIAGNOSIS — F329 Major depressive disorder, single episode, unspecified: Secondary | ICD-10-CM | POA: Diagnosis not present

## 2018-04-17 DIAGNOSIS — N200 Calculus of kidney: Secondary | ICD-10-CM | POA: Diagnosis not present

## 2018-04-17 DIAGNOSIS — Z6833 Body mass index (BMI) 33.0-33.9, adult: Secondary | ICD-10-CM | POA: Diagnosis not present

## 2018-04-17 DIAGNOSIS — Z79899 Other long term (current) drug therapy: Secondary | ICD-10-CM | POA: Diagnosis not present

## 2018-04-17 DIAGNOSIS — Z7951 Long term (current) use of inhaled steroids: Secondary | ICD-10-CM | POA: Diagnosis not present

## 2018-04-17 DIAGNOSIS — E669 Obesity, unspecified: Secondary | ICD-10-CM | POA: Diagnosis not present

## 2018-04-17 DIAGNOSIS — F419 Anxiety disorder, unspecified: Secondary | ICD-10-CM | POA: Diagnosis not present

## 2018-04-17 LAB — URINALYSIS, ROUTINE W REFLEX MICROSCOPIC
Bilirubin Urine: NEGATIVE
Glucose, UA: NEGATIVE mg/dL
Ketones, ur: NEGATIVE mg/dL
Leukocytes, UA: NEGATIVE
Nitrite: NEGATIVE
Protein, ur: 100 mg/dL — AB
RBC / HPF: >50 RBC/hpf — ABNORMAL HIGH (ref 0–5)
Specific Gravity, Urine: 1.01 (ref 1.005–1.030)
pH: 6 (ref 5.0–8.0)

## 2018-04-17 MED ORDER — FENTANYL CITRATE (PF) 100 MCG/2ML IJ SOLN
100.0000 ug | Freq: Once | INTRAMUSCULAR | Status: AC
  Administered 2018-04-17: 100 ug via INTRAVENOUS
  Filled 2018-04-17: qty 2

## 2018-04-17 MED ORDER — ONDANSETRON HCL 4 MG/2ML IJ SOLN
4.0000 mg | Freq: Once | INTRAMUSCULAR | Status: AC
  Administered 2018-04-17: 4 mg via INTRAVENOUS
  Filled 2018-04-17: qty 2

## 2018-04-17 MED ORDER — SODIUM CHLORIDE 0.9 % IV BOLUS (SEPSIS)
1000.0000 mL | Freq: Once | INTRAVENOUS | Status: AC
  Administered 2018-04-17: 1000 mL via INTRAVENOUS

## 2018-04-17 MED ORDER — KETOROLAC TROMETHAMINE 30 MG/ML IJ SOLN
30.0000 mg | Freq: Once | INTRAMUSCULAR | Status: AC
  Administered 2018-04-17: 30 mg via INTRAVENOUS
  Filled 2018-04-17: qty 1

## 2018-04-17 NOTE — ED Provider Notes (Signed)
[facility] DEPT Provider Note   CSN: [id number] Arrival date & time: 04/16/18  2228     History   Chief Complaint Chief Complaint  Patient presents with  . Flank Pain    HPI Lori Fisher is a 42 y.o. female.  The history is provided by the spouse and the patient.  Flank Pain  This is a new problem. The current episode started 3 to 5 hours ago. The problem occurs constantly. The problem has not changed since onset.Associated symptoms include abdominal pain. Pertinent negatives include no chest pain. Nothing aggravates the symptoms. Nothing relieves the symptoms.   Patient with history of depression kidney stones presents with left flank and abdominal pain.  She underwent a lithotripsy yesterday morning for a large left renal stone.  She had no immediate complications after the procedure.  However several hours ago she began having left flank abdominal pain.  She reports difficulty urinating as well as hematuria.  No fevers or vomiting. She reports previous history of lithotripsy on the right kidney, but does not recall this type of pain. Past Medical History:  Diagnosis Date  . Allergy   . Anxiety   . Depression   . History of kidney stones   . Hyperlipidemia     Patient Active Problem List   Diagnosis Date Noted  . Class 1 obesity due to excess calories without serious comorbidity with body mass index (BMI) of 32.0 to 32.9 in adult 03/14/2017  . Environmental and seasonal allergies 03/14/2017  . Generalized anxiety disorder 03/14/2017  . Pure hypercholesterolemia 03/14/2017    Past Surgical History:  Procedure Laterality Date  . LITHOTRIPSY       OB History   None      Home Medications    Prior to Admission medications   Medication Sig Start Date End Date Taking? Authorizing Provider  azelastine (ASTELIN) 0.1 % nasal spray Place 1 spray into both nostrils daily. Use in each nostril as directed   Yes [provider]    fluticasone (FLONASE) 50 MCG/ACT nasal spray Place 2 sprays into both nostrils daily for 10 days. 10/06/17 04/16/18 Yes Kara Dies, NP  levocetirizine (XYZAL) 2.5 MG/5ML solution Take 2.5 mg by mouth every evening.   Yes [provider]  loratadine (CLARITIN) 10 MG tablet Take 10 mg by mouth daily.   Yes [provider]  MELATONIN GUMMIES PO Take 5 mg by mouth.   Yes [provider]  montelukast (SINGULAIR) 10 MG tablet Take 1 tablet (10 mg total) by mouth at bedtime. 03/28/18  Yes Scot Jun, FNP  Multiple Vitamin (MULTIVITAMINS PO) Take 1 tablet by mouth daily.   Yes [provider]  oxyCODONE-acetaminophen (PERCOCET/ROXICET) 5-325 MG tablet Take 1 tablet by mouth every 4 (four) hours as needed for severe pain.   Yes [provider]  Probiotic Product (PROBIOTIC-10 PO) Take by mouth.   Yes [provider]  sertraline (ZOLOFT) 100 MG tablet TAKE 1 TABLET BY MOUTH ONCE DAILY 04/16/18  Yes Shawnee Knapp, MD  tamsulosin (FLOMAX) 0.4 MG CAPS capsule Take 0.4 mg by mouth daily. 04/06/18   [provider]    Family History Family History  Problem Relation Age of Onset  . Cancer Father   . Diabetes Sister     Social History Social History   Tobacco Use  . Smoking status: Never Smoker  . Smokeless tobacco: Never Used  Substance Use Topics  . Alcohol use: Yes  Alcohol/week: 1.0 standard drinks    Types: 1 Glasses of wine per week  . Drug use: No     Allergies   Patient has no known allergies.   Review of Systems Review of Systems  Constitutional: Negative for fever.  Cardiovascular: Negative for chest pain.  Gastrointestinal: Positive for abdominal pain and constipation. Negative for vomiting.  Genitourinary: Positive for difficulty urinating, flank pain and hematuria.  All other systems reviewed and are negative.    Physical Exam Updated Vital Signs BP (!) 144/86   Pulse 92   Temp 98.2 F  (36.8 C) (Oral)   Resp 18   LMP 03/24/2018   SpO2 98%   Physical Exam  CONSTITUTIONAL: Well developed/well nourished, no acute distress HEAD: Normocephalic/atraumatic EYES: EOMI/PERRL ENMT: Mucous membranes moist NECK: supple no meningeal signs SPINE/BACK:entire spine nontender CV: S1/S2 noted, no murmurs/rubs/gallops noted LUNGS: Lungs are clear to auscultation bilaterally, no apparent distress ABDOMEN: soft, mild LLQ tenderness, no rebound or guarding, bowel sounds noted throughout abdomen GU: Bandage of the left flank, left CVA tenderness noted NEURO: Pt is awake/alert/appropriate, moves all extremitiesx4.  No facial droop.   EXTREMITIES: pulses normal/equal, full ROM SKIN: warm, color normal PSYCH: no abnormalities of mood noted, alert and oriented to situation  ED Treatments / Results  Labs (all labs ordered are listed, but only abnormal results are displayed) Labs Reviewed  URINE CULTURE  URINALYSIS, ROUTINE W REFLEX MICROSCOPIC    EKG None  Radiology Dg Abd 1 View  Result Date: 04/16/2018 CLINICAL DATA:  Left renal calculus. EXAM: ABDOMEN - 1 VIEW COMPARISON:  Radiograph of April 06, 2018. FINDINGS: The bowel gas pattern is normal. Stable large left renal calculus is noted. Probable small right renal calculus is noted. Phleboliths are noted in the pelvis. IMPRESSION: Large left renal calculus is again noted. Probable small right renal calculus is noted. Electronically Signed   By: Marijo Conception, M.D.   On: 04/16/2018 09:21    Procedures Procedures    Medications Ordered in ED Medications  fentaNYL (SUBLIMAZE) injection 100 mcg (100 mcg Intravenous Given 04/17/18 0141)  sodium chloride 0.9 % bolus 1,000 mL (0 mLs Intravenous Stopped 04/17/18 0248)  ondansetron (ZOFRAN) injection 4 mg (4 mg Intravenous Given 04/17/18 0141)  ketorolac (TORADOL) 30 MG/ML injection 30 mg (30 mg Intravenous Given 04/17/18 0248)     Initial Impression / Assessment and Plan / ED  Course  I have reviewed the triage vital signs and the nursing notes.  Pertinent labs results that were available during my care of the patient were reviewed by me and considered in my medical decision making (see chart for details).     Patient presented with left flank/abdominal pain after having recent lithotripsy.  She was also noted to have hematuria.  She is now improved, she is afebrile.  No signs of urinary retention.  She has pain medications at home.  Urinalysis reveals hematuria without UTI.  She is appropriate for discharge and follow-up with urology PT feels improved and feels comfortable for discharge  Final Clinical Impressions(s) / ED Diagnoses   Final diagnoses:  Flank pain    ED Discharge Orders    None       Ripley Fraise, MD 04/17/18 [phone]

## 2018-04-18 LAB — URINE CULTURE: Culture: 40000 — AB

## 2018-04-24 DIAGNOSIS — R1084 Generalized abdominal pain: Secondary | ICD-10-CM | POA: Diagnosis not present

## 2018-04-24 DIAGNOSIS — N200 Calculus of kidney: Secondary | ICD-10-CM | POA: Diagnosis not present

## 2018-04-24 DIAGNOSIS — R8271 Bacteriuria: Secondary | ICD-10-CM | POA: Diagnosis not present

## 2018-04-30 DIAGNOSIS — N200 Calculus of kidney: Secondary | ICD-10-CM | POA: Diagnosis not present

## 2018-04-30 DIAGNOSIS — R8271 Bacteriuria: Secondary | ICD-10-CM | POA: Diagnosis not present

## 2018-05-12 MED FILL — MONTELUKAST SOD 10 MG TAB: 10 | 90 days supply | Qty: 90 | Fill #0

## 2018-05-18 MED FILL — LEVOCETIRIZINE 5 MG TABLET: 5 | 30 days supply | Qty: 30 | Fill #2

## 2018-05-19 DIAGNOSIS — N200 Calculus of kidney: Secondary | ICD-10-CM | POA: Diagnosis not present

## 2018-05-22 ENCOUNTER — Encounter: Payer: Self-pay | Admitting: Family Medicine

## 2018-05-22 ENCOUNTER — Other Ambulatory Visit: Payer: Self-pay

## 2018-05-22 ENCOUNTER — Ambulatory Visit (INDEPENDENT_AMBULATORY_CARE_PROVIDER_SITE_OTHER): Payer: 59 | Admitting: Family Medicine

## 2018-05-22 VITALS — BP 115/69 | HR 97 | Temp 98.9°F | Resp 16 | Ht 63.39 in | Wt 185.6 lb

## 2018-05-22 DIAGNOSIS — Z1383 Encounter for screening for respiratory disorder NEC: Secondary | ICD-10-CM | POA: Diagnosis not present

## 2018-05-22 DIAGNOSIS — E6609 Other obesity due to excess calories: Secondary | ICD-10-CM | POA: Diagnosis not present

## 2018-05-22 DIAGNOSIS — Z136 Encounter for screening for cardiovascular disorders: Secondary | ICD-10-CM | POA: Diagnosis not present

## 2018-05-22 DIAGNOSIS — J3089 Other allergic rhinitis: Secondary | ICD-10-CM

## 2018-05-22 DIAGNOSIS — Z131 Encounter for screening for diabetes mellitus: Secondary | ICD-10-CM

## 2018-05-22 DIAGNOSIS — Z23 Encounter for immunization: Secondary | ICD-10-CM

## 2018-05-22 DIAGNOSIS — Z1329 Encounter for screening for other suspected endocrine disorder: Secondary | ICD-10-CM | POA: Diagnosis not present

## 2018-05-22 DIAGNOSIS — Z800 Family history of malignant neoplasm of digestive organs: Secondary | ICD-10-CM

## 2018-05-22 DIAGNOSIS — E7800 Pure hypercholesterolemia, unspecified: Secondary | ICD-10-CM | POA: Diagnosis not present

## 2018-05-22 DIAGNOSIS — Z6832 Body mass index (BMI) 32.0-32.9, adult: Secondary | ICD-10-CM

## 2018-05-22 DIAGNOSIS — Z1231 Encounter for screening mammogram for malignant neoplasm of breast: Secondary | ICD-10-CM

## 2018-05-22 DIAGNOSIS — Z0001 Encounter for general adult medical examination with abnormal findings: Secondary | ICD-10-CM

## 2018-05-22 DIAGNOSIS — F411 Generalized anxiety disorder: Secondary | ICD-10-CM

## 2018-05-22 DIAGNOSIS — Z1389 Encounter for screening for other disorder: Secondary | ICD-10-CM

## 2018-05-22 DIAGNOSIS — Z0000 Encounter for general adult medical examination without abnormal findings: Secondary | ICD-10-CM

## 2018-05-22 DIAGNOSIS — R198 Other specified symptoms and signs involving the digestive system and abdomen: Secondary | ICD-10-CM

## 2018-05-22 DIAGNOSIS — Z130 Encounter for screening for diseases of the blood and blood-forming organs and certain disorders involving the immune mechanism: Secondary | ICD-10-CM | POA: Diagnosis not present

## 2018-05-22 LAB — POCT URINALYSIS DIP (MANUAL ENTRY)
Bilirubin, UA: NEGATIVE
Glucose, UA: NEGATIVE mg/dL
Ketones, POC UA: NEGATIVE mg/dL
Nitrite, UA: NEGATIVE
Spec Grav, UA: 1.025 (ref 1.010–1.025)
Urobilinogen, UA: 0.2 E.U./dL
pH, UA: 6 (ref 5.0–8.0)

## 2018-05-22 LAB — POC MICROSCOPIC URINALYSIS (UMFC): Mucus: ABSENT

## 2018-05-22 MED ORDER — AZELASTINE HCL 0.1 % NA SOLN: 1.0000 | Freq: Every day | NASAL | 5 refills | Status: DC

## 2018-05-22 MED ORDER — SERTRALINE HCL 100 MG PO TABS: 100.0000 mg | ORAL_TABLET | Freq: Every day | ORAL | 3 refills | Status: DC

## 2018-05-22 MED ORDER — MONTELUKAST SODIUM 10 MG PO TABS: 10.0000 mg | ORAL_TABLET | Freq: Every day | ORAL | 3 refills | Status: DC

## 2018-05-22 MED ORDER — LEVOCETIRIZINE DIHYDROCHLORIDE 5 MG PO TABS: 5.0000 mg | ORAL_TABLET | Freq: Every evening | ORAL | 3 refills | Status: DC

## 2018-05-22 NOTE — Progress Notes (Signed)
Subjective:    Patient ID: Lori Fisher; female   DOB: 03-22-1976; 42 y.o.   MRN: [id number]  Chief Complaint  Patient presents with  . Annual Exam    HPI Primary Preventative Screenings: Cervical Cancer:  No h/o abnormal pap prior Family Planning: STI screening: declines needs  Breast Cancer: none prior Colorectal Cancer: Tobacco use/EtOH/substances: declines Bone Density: Cardiac: none prior but declines sxs Weight/Blood sugar/Diet/Exercise:   BMI Readings from Last 3 Encounters:  05/22/18 32.48 kg/m  04/16/18 33.87 kg/m  03/28/18 33.87 kg/m   Lab Results  Component Value Date   HGBA1C 5.0 03/15/2017   OTC/Vit/Supp/Herbal: list accurate Dentist/Optho: sees dentist regularly - was being followed by ophthalmologist  Immunizations:  Immunization History  Administered Date(s) Administered  . Influenza,inj,Quad PF,6+ Mos 03/10/2012  . Influenza-Unspecified 03/10/2017  . MMR 03/11/2014     Chronic Medical Conditions:Kidney stone had to have lithotripsied - following with [facility]. 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  Stones are calcium oxalate.   Father with 60-70s in colon cancer.   Medical History: Past Medical History:  Diagnosis Date  . Allergy   . Anxiety   . Depression   . History of kidney stones   . Hyperlipidemia    Past Surgical History:  Procedure Laterality Date  . EXTRACORPOREAL SHOCK WAVE LITHOTRIPSY Left 04/16/2018   Procedure: EXTRACORPOREAL SHOCK WAVE LITHOTRIPSY (ESWL);  Surgeon: Ardis Hughs, MD;  Location: [facility];  Service: Urology;  Laterality: Left;  . LITHOTRIPSY     Current Outpatient Medications on File Prior to Visit  Medication Sig Dispense Refill  . azelastine (ASTELIN) 0.1 % nasal spray Place 1 spray into both nostrils daily. Use in each nostril as directed    . levocetirizine (XYZAL) 2.5 MG/5ML solution Take 2.5 mg by mouth every evening.    Marland Kitchen MELATONIN GUMMIES PO Take 5  mg by mouth.    . montelukast (SINGULAIR) 10 MG tablet Take 1 tablet (10 mg total) by mouth at bedtime. 90 tablet 1  . Multiple Vitamin (MULTIVITAMINS PO) Take 1 tablet by mouth daily.    . Probiotic Product (PROBIOTIC-10 PO) Take by mouth.    . sertraline (ZOLOFT) 100 MG tablet TAKE 1 TABLET BY MOUTH ONCE DAILY 90 tablet 0  . fluticasone (FLONASE) 50 MCG/ACT nasal spray Place 2 sprays into both nostrils daily for 10 days. 16 g 0   No current facility-administered medications on file prior to visit.    No Known Allergies Family History  Problem Relation Age of Onset  . Cancer Father   . Diabetes Sister    Social History   Socioeconomic History  . Marital status: Married    Spouse name: Not on file  . Number of children: Not on file  . Years of education: Not on file  . Highest education level: Not on file  Occupational History  . Not on file  Social Needs  . Financial resource strain: Not on file  . Food insecurity:    Worry: Not on file    Inability: Not on file  . Transportation needs:    Medical: Not on file    Non-medical: Not on file  Tobacco Use  . Smoking status: Never Smoker  . Smokeless tobacco: Never Used  Substance and Sexual Activity  . Alcohol use: Yes    Alcohol/week: 1.0 standard drinks    Types: 1 Glasses of wine per week  . Drug  use: No  . Sexual activity: Not on file  Lifestyle  . Physical activity:    Days per week: Not on file    Minutes per session: Not on file  . Stress: Not on file  Relationships  . Social connections:    Talks on phone: Not on file    Gets together: Not on file    Attends religious service: Not on file    Active member of club or organization: Not on file    Attends meetings of clubs or organizations: Not on file    Relationship status: Not on file  Other Topics Concern  . Not on file  Social History Narrative  . Not on file   Depression screen [facility] 2/9 03/28/2018 03/15/2017 11/09/2016 10/30/2016  Decreased Interest 0 0  - 0  Down, Depressed, Hopeless 0 0 0 0  PHQ - 2 Score 0 0 0 0     ROS Otherwise as noted in HPI.  Objective:  BP 115/69   Pulse 97   Temp 98.9 F (37.2 C) (Oral)   Resp 16   Ht 5' 3.39" (1.61 m)   Wt 185 lb 9.6 oz (84.2 kg)   SpO2 95%   BMI 32.48 kg/m   Visual Acuity Screening   Right eye Left eye Both eyes  Without correction:     With correction: '20/20 20/20 20/20 '   Physical Exam Constitutional:      General: She is not in acute distress.    Appearance: She is well-developed. She is not diaphoretic.  HENT:     Head: Normocephalic and atraumatic.     Right Ear: Tympanic membrane, ear canal and external ear normal.     Left Ear: Tympanic membrane, ear canal and external ear normal.     Nose: Nose normal. No mucosal edema or rhinorrhea.     Mouth/Throat:     Pharynx: Uvula midline. No posterior oropharyngeal erythema.  Eyes:     General: No scleral icterus.       Right eye: No discharge.        Left eye: No discharge.     Conjunctiva/sclera: Conjunctivae normal.     Pupils: Pupils are equal, round, and reactive to light.  Neck:     Musculoskeletal: Normal range of motion and neck supple.     Thyroid: No thyromegaly.  Cardiovascular:     Rate and Rhythm: Normal rate and regular rhythm.     Heart sounds: Normal heart sounds.  Pulmonary:     Effort: Pulmonary effort is normal. No respiratory distress.     Breath sounds: Normal breath sounds.  Abdominal:     General: Bowel sounds are normal.     Palpations: Abdomen is soft.     Tenderness: There is no abdominal tenderness.  Lymphadenopathy:     Cervical: No cervical adenopathy.  Skin:    General: Skin is warm and dry.     Findings: No erythema.  Neurological:     Mental Status: She is alert and oriented to person, place, and time.     Deep Tendon Reflexes: Reflexes are normal and symmetric.  Psychiatric:        Behavior: Behavior normal.          [facility] Office Visit on 05/22/2018  Component  Date Value Ref Range Status  . WBC 05/22/2018 8.0  3.4 - 10.8 x10E3/uL Final  . RBC 05/22/2018 4.53  3.77 - 5.28 x10E6/uL Final  . Hemoglobin 05/22/2018 13.5  11.1 -  15.9 g/dL Final  . Hematocrit 05/22/2018 40.6  34.0 - 46.6 % Final  . MCV 05/22/2018 90  79 - 97 fL Final  . MCH 05/22/2018 29.8  26.6 - 33.0 pg Final  . MCHC 05/22/2018 33.3  31.5 - 35.7 g/dL Final  . RDW 05/22/2018 13.0  12.3 - 15.4 % Final   Comment: **Effective June 15, 2018, the RDW pediatric reference**   interval will be removed and the adult reference interval   will be changing to:                             Female 11.7 - 15.4                                                      Female 11.6 - 15.4   . Platelets 05/22/2018 287  150 - 450 x10E3/uL Final  . Neutrophils 05/22/2018 57  Not Estab. % Final  . Lymphs 05/22/2018 35  Not Estab. % Final  . Monocytes 05/22/2018 5  Not Estab. % Final  . Eos 05/22/2018 2  Not Estab. % Final  . Basos 05/22/2018 1  Not Estab. % Final  . Neutrophils Absolute 05/22/2018 4.6  1.4 - 7.0 x10E3/uL Final  . Lymphocytes Absolute 05/22/2018 2.8  0.7 - 3.1 x10E3/uL Final  . Monocytes Absolute 05/22/2018 0.4  0.1 - 0.9 x10E3/uL Final  . EOS (ABSOLUTE) 05/22/2018 0.1  0.0 - 0.4 x10E3/uL Final  . Basophils Absolute 05/22/2018 0.1  0.0 - 0.2 x10E3/uL Final  . Immature Granulocytes 05/22/2018 0  Not Estab. % Final  . Immature Grans (Abs) 05/22/2018 0.0  0.0 - 0.1 x10E3/uL Final  . Glucose 05/22/2018 92  65 - 99 mg/dL Final  . BUN 05/22/2018 10  6 - 24 mg/dL Final  . Creatinine, Ser 05/22/2018 0.67  0.57 - 1.00 mg/dL Final  . GFR calc non Af Amer 05/22/2018 108  >59 mL/min/1.73 Final  . GFR calc Af Amer 05/22/2018 125  >59 mL/min/1.73 Final  . BUN/Creatinine Ratio 05/22/2018 15  9 - 23 Final  . Sodium 05/22/2018 139  134 - 144 mmol/L Final  . Potassium 05/22/2018 4.2  3.5 - 5.2 mmol/L Final  . Chloride 05/22/2018 100  96 - 106 mmol/L Final  . CO2 05/22/2018 22  20 - 29 mmol/L Final  .  Calcium 05/22/2018 9.1  8.7 - 10.2 mg/dL Final  . Total Protein 05/22/2018 6.7  6.0 - 8.5 g/dL Final  . Albumin 05/22/2018 4.6  3.5 - 5.5 g/dL Final  . Globulin, Total 05/22/2018 2.1  1.5 - 4.5 g/dL Final  . Albumin/Globulin Ratio 05/22/2018 2.2  1.2 - 2.2 Final  . Bilirubin Total 05/22/2018 0.4  0.0 - 1.2 mg/dL Final  . Alkaline Phosphatase 05/22/2018 137* 39 - 117 IU/L Final  . AST 05/22/2018 18  0 - 40 IU/L Final  . ALT 05/22/2018 21  0 - 32 IU/L Final  . Hgb A1c MFr Bld 05/22/2018 5.0  4.8 - 5.6 % Final   Comment:          Prediabetes: 5.7 - 6.4          Diabetes: >6.4          Glycemic control for adults with diabetes: <7.0   . Est. average  glucose Bld gHb Est-m* 05/22/2018 97  mg/dL Final  . Cholesterol, Total 05/22/2018 264* 100 - 199 mg/dL Final  . Triglycerides 05/22/2018 230* 0 - 149 mg/dL Final  . HDL 05/22/2018 46  >39 mg/dL Final  . VLDL Cholesterol Cal 05/22/2018 46* 5 - 40 mg/dL Final  . LDL Calculated 05/22/2018 172* 0 - 99 mg/dL Final  . Chol/HDL Ratio 05/22/2018 5.7* 0.0 - 4.4 ratio Final   Comment:                                   T. Chol/HDL Ratio                                             Men  Women                               1/2 Avg.Risk  3.4    3.3                                   Avg.Risk  5.0    4.4                                2X Avg.Risk  9.6    7.1                                3X Avg.Risk 23.4   11.0   . TSH 05/22/2018 1.720  0.450 - 4.500 uIU/mL Final  . Color, UA 05/22/2018 yellow  yellow Final  . Clarity, UA 05/22/2018 cloudy* clear Final  . Glucose, UA 05/22/2018 negative  negative mg/dL Final  . Bilirubin, UA 05/22/2018 negative  negative Final  . Ketones, POC UA 05/22/2018 negative  negative mg/dL Final  . Spec Grav, UA 05/22/2018 1.025  1.010 - 1.025 Final  . Blood, UA 05/22/2018 trace-intact* negative Final  . pH, UA 05/22/2018 6.0  5.0 - 8.0 Final  . Protein Ur, POC 05/22/2018 trace* negative mg/dL Final  . Urobilinogen, UA  05/22/2018 0.2  0.2 or 1.0 E.U./dL Final  . Nitrite, UA 05/22/2018 Negative  Negative Final  . Leukocytes, UA 05/22/2018 Small (1+)* Negative Final  . WBC,UR,HPF,POC 05/22/2018 Moderate* None WBC/hpf Final  . RBC,UR,HPF,POC 05/22/2018 None  None RBC/hpf Final  . Bacteria 05/22/2018 Many* None, Too numerous to count Final  . Mucus 05/22/2018 Absent  Absent Final  . Epithelial Cells, UR Per Microscopy 05/22/2018 Moderate* None, Too numerous to count cells/hpf Final     Assessment & Plan:   1. Annual physical exam   2. Screening for cardiovascular, respiratory, and genitourinary diseases   3. Screening for deficiency anemia   4. Screening for thyroid disorder   5. Screening for diabetes mellitus   6. Environmental and seasonal allergies   7. Generalized anxiety disorder   8. Pure hypercholesterolemia   9. Class 1 obesity due to excess calories without serious comorbidity with body mass index (BMI) of 32.0 to 32.9 in adult   10. Encounter for screening mammogram for breast cancer   11. Alternating constipation and  diarrhea   12. Family history of colon cancer in father    Patient will continue on current chronic medications other than changes noted above, so ok to refill when needed.   Reviewed all health maintenance recommendations per USPSTF guidelines.   See after visit summary for patient specific instructions.  Orders Placed This Encounter  Procedures  . MM DIGITAL SCREENING BILATERAL    [facility] PF;    Standing Status:   Future    Standing Expiration Date:   07/24/2019    Order Specific Question:   Reason for Exam (SYMPTOM  OR DIAGNOSIS REQUIRED)    Answer:   ANNUAL    Order Specific Question:   Preferred imaging location?    Answer:   [facility]    Order Specific Question:   Is the patient pregnant?    Answer:   No  . Tdap vaccine greater than or equal to 7yo IM  . CBC with Differential/Platelet  . Comprehensive metabolic panel    Order Specific Question:   Has the  patient fasted?    Answer:   No  . Hemoglobin A1c  . Lipid panel    Order Specific Question:   Has the patient fasted?    Answer:   No  . TSH  . Ambulatory referral to Gastroenterology    Referral Priority:   Routine    Referral Type:   Consultation    Referral Reason:   Specialty Services Required    Number of Visits Requested:   1  . POCT urinalysis dipstick  . POCT Microscopic Urinalysis ([facility])    Meds ordered this encounter  Medications  . azelastine (ASTELIN) 0.1 % nasal spray    Sig: Place 1 spray into both nostrils daily. Use in each nostril as directed    Dispense:  30 mL    Refill:  5  . montelukast (SINGULAIR) 10 MG tablet    Sig: Take 1 tablet (10 mg total) by mouth at bedtime.    Dispense:  90 tablet    Refill:  3  . sertraline (ZOLOFT) 100 MG tablet    Sig: Take 1 tablet (100 mg total) by mouth daily.    Dispense:  90 tablet    Refill:  3  . levocetirizine (XYZAL) 5 MG tablet    Sig: Take 1 tablet (5 mg total) by mouth every evening.    Dispense:  90 tablet    Refill:  3    Patient verbalized to me that they understand the following: diagnosis, what is being done for them, what to expect and what should be done at home.  Their questions have been answered. They understand that I am unable to predict every possible medication interaction or adverse outcome and that if any unexpected symptoms arise, they should contact us and their pharmacist, as well as never hesitate to seek urgent/emergent care at [facility] or ER if they think it might be warranted.    [name], MD, MPH [facility] [address] [place], [place]  [address] [phone] Office phone  [phone] Office fax   05/22/18 9:23 AM

## 2018-05-22 NOTE — Patient Instructions (Addendum)
You need to schedule your mammogram.  Please call [facility] at [phone] to schedule.  [facility] [address] - near [place].        If you have lab work done today you will be contacted with your lab results within the next 2 weeks.  If you have not heard from [place] then please contact us. The fastest way to get your results is to register for My Chart.   IF you received an x-ray today, you will receive an invoice from [facility]. Please contact [facility] at [phone] with questions or concerns regarding your invoice.   IF you received labwork today, you will receive an invoice from [facility]. Please contact [facility] at [phone] with questions or concerns regarding your invoice.   Our billing staff will not be able to assist you with questions regarding bills from these companies.  You will be contacted with the lab results as soon as they are available. The fastest way to get your results is to activate your My Chart account. Instructions are located on the last page of this paperwork. If you have not heard from [place] regarding the results in 2 weeks, please contact this office.    Irritable Bowel Syndrome, Adult Irritable bowel syndrome (IBS) is not one specific disease. It is a group of symptoms that affects the organs responsible for digestion (gastrointestinal or GI tract). To regulate how your GI tract works, your body sends signals back and forth between your intestines and your brain. If you have IBS, there may be a problem with these signals. As a result, your GI tract does not function normally. Your intestines may become more sensitive and overreact to certain things. This is especially true when you eat certain foods or when you are under stress. There are four types of IBS. These may be determined based on the consistency of your stool:  IBS with diarrhea.  IBS with constipation.  Mixed  IBS.  Unsubtyped IBS.  It is important to know which type of IBS you have. Some treatments are more likely to be helpful for certain types of IBS. What are the causes? The exact cause of IBS is not known. What increases the risk? You may have a higher risk of IBS if:  You are a woman.  You are younger than 43 years old.  You have a family history of IBS.  You have mental health problems.  You have had bacterial infection of your GI tract.  What are the signs or symptoms? Symptoms of IBS vary from person to person. The main symptom is abdominal pain or discomfort. Additional symptoms usually include one or more of the following:  Diarrhea, constipation, or both.  Abdominal swelling or bloating.  Feeling full or sick after eating a small or regular-size meal.  Frequent gas.  Mucus in the stool.  A feeling of having more stool left after a bowel movement.  Symptoms tend to come and go. They may be associated with stress, psychiatric conditions, or nothing at all. How is this diagnosed? There is no specific test to diagnose IBS. Your health care provider will make a diagnosis based on a physical exam, medical history, and your symptoms. You may have other tests to rule out other conditions that may be causing your symptoms. These may include:  Blood tests.  X-rays.  CT scan.  Endoscopy and colonoscopy. This is a test in which your GI tract is viewed with a long,  thin, flexible tube.  How is this treated? There is no cure for IBS, but treatment can help relieve symptoms. IBS treatment often includes:  Changes to your diet, such as: ? Eating more fiber. ? Avoiding foods that cause symptoms. ? Drinking more water. ? Eating regular, medium-sized portioned meals.  Medicines. These may include: ? Fiber supplements if you have constipation. ? Medicine to control diarrhea (antidiarrheal medicines). ? Medicine to help control muscle spasms in your GI tract (antispasmodic  medicines). ? Medicines to help with any mental health issues, such as antidepressants or tranquilizers.  Therapy. ? Talk therapy may help with anxiety, depression, or other mental health issues that can make IBS symptoms worse.  Stress reduction. ? Managing your stress can help keep symptoms under control.  Follow these instructions at home:  Take medicines only as directed by your health care provider.  Eat a healthy diet. ? Avoid foods and drinks with added sugar. ? Include more whole grains, fruits, and vegetables gradually into your diet. This may be especially helpful if you have IBS with constipation. ? Avoid any foods and drinks that make your symptoms worse. These may include dairy products and caffeinated or carbonated drinks. ? Do not eat large meals. ? Drink enough fluid to keep your urine clear or pale yellow.  Exercise regularly. Ask your health care provider for recommendations of good activities for you.  Keep all follow-up visits as directed by your health care provider. This is important. Contact a health care provider if:  You have constant pain.  You have trouble or pain with swallowing.  You have worsening diarrhea. Get help right away if:  You have severe and worsening abdominal pain.  You have diarrhea and: ? You have a rash, stiff neck, or severe headache. ? You are irritable, sleepy, or difficult to awaken. ? You are weak, dizzy, or extremely thirsty.  You have bright red blood in your stool or you have black tarry stools.  You have unusual abdominal swelling that is painful.  You vomit continuously.  You vomit blood (hematemesis).  You have both abdominal pain and a fever. This information is not intended to replace advice given to you by your health care provider. Make sure you discuss any questions you have with your health care provider. Document Released: 05/27/2005 Document Revised: 10/27/2015 Document Reviewed: 02/11/2014 Elsevier  Interactive Patient Education  2018 [employer]. Diet for Irritable Bowel Syndrome When you have irritable bowel syndrome (IBS), the foods you eat and your eating habits are very important. IBS may cause various symptoms, such as abdominal pain, constipation, or diarrhea. Choosing the right foods can help ease discomfort caused by these symptoms. Work with your health care provider and dietitian to find the best eating plan to help control your symptoms. What general guidelines do I need to follow?  Keep a food diary. This will help you identify foods that cause symptoms. Write down: ? What you eat and when. ? What symptoms you have. ? When symptoms occur in relation to your meals.  Avoid foods that cause symptoms. Talk with your dietitian about other ways to get the same nutrients that are in these foods.  Eat more foods that contain fiber. Take a fiber supplement if directed by your dietitian.  Eat your meals slowly, in a relaxed setting.  Aim to eat 5-6 small meals per day. Do not skip meals.  Drink enough fluids to keep your urine clear or pale yellow.  Ask your health care  provider if you should take an over-the-counter probiotic during flare-ups to help restore healthy gut bacteria.  If you have cramping or diarrhea, try making your meals low in fat and high in carbohydrates. Examples of carbohydrates are pasta, rice, whole grain breads and cereals, fruits, and vegetables.  If dairy products cause your symptoms to flare up, try eating less of them. You might be able to handle yogurt better than other dairy products because it contains bacteria that help with digestion. What foods are not recommended? The following are some foods and drinks that may worsen your symptoms:  Fatty foods, such as [place] fries.  Milk products, such as cheese or ice cream.  Chocolate.  Alcohol.  Products with caffeine, such as coffee.  Carbonated drinks, such as soda.  The items listed above  may not be a complete list of foods and beverages to avoid. Contact your dietitian for more information. What foods are good sources of fiber? Your health care provider or dietitian may recommend that you eat more foods that contain fiber. Fiber can help reduce constipation and other IBS symptoms. Add foods with fiber to your diet a little at a time so that your body can get used to them. Too much fiber at once might cause gas and swelling of your abdomen. The following are some foods that are good sources of fiber:  Apples.  Peaches.  Pears.  Berries.  Figs.  Broccoli (raw).  Cabbage.  Carrots.  Raw peas.  Kidney beans.  Lima beans.  Whole grain bread.  Whole grain cereal.  Where to find more information: [employer] for Functional Gastrointestinal Disorders: www.iffgd.[employer] of Diabetes and Digestive and Kidney Diseases: NetworkAffair.co.za.aspx This information is not intended to replace advice given to you by your health care provider. Make sure you discuss any questions you have with your health care provider. Document Released: 08/17/2003 Document Revised: 11/02/2015 Document Reviewed: 08/27/2013 Elsevier Interactive Patient Education  2018 [employer].

## 2018-05-23 LAB — HEMOGLOBIN A1C
Est. average glucose Bld gHb Est-mCnc: 97 mg/dL
Hgb A1c MFr Bld: 5 % (ref 4.8–5.6)

## 2018-05-23 LAB — COMPREHENSIVE METABOLIC PANEL
ALT: 21 IU/L (ref 0–32)
AST: 18 IU/L (ref 0–40)
Albumin/Globulin Ratio: 2.2 (ref 1.2–2.2)
Albumin: 4.6 g/dL (ref 3.5–5.5)
Alkaline Phosphatase: 137 IU/L — ABNORMAL HIGH (ref 39–117)
BUN/Creatinine Ratio: 15 (ref 9–23)
BUN: 10 mg/dL (ref 6–24)
Bilirubin Total: 0.4 mg/dL (ref 0.0–1.2)
CO2: 22 mmol/L (ref 20–29)
Calcium: 9.1 mg/dL (ref 8.7–10.2)
Chloride: 100 mmol/L (ref 96–106)
Creatinine, Ser: 0.67 mg/dL (ref 0.57–1.00)
GFR calc Af Amer: 125 mL/min/{1.73_m2} (ref >59)
GFR calc non Af Amer: 108 mL/min/{1.73_m2} (ref >59)
Globulin, Total: 2.1 g/dL (ref 1.5–4.5)
Glucose: 92 mg/dL (ref 65–99)
Potassium: 4.2 mmol/L (ref 3.5–5.2)
Sodium: 139 mmol/L (ref 134–144)
Total Protein: 6.7 g/dL (ref 6.0–8.5)

## 2018-05-23 LAB — CBC WITH DIFFERENTIAL/PLATELET
Basophils Absolute: 0.1 10*3/uL (ref 0.0–0.2)
Basos: 1 %
EOS (ABSOLUTE): 0.1 10*3/uL (ref 0.0–0.4)
Eos: 2 %
Hematocrit: 40.6 % (ref 34.0–46.6)
Hemoglobin: 13.5 g/dL (ref 11.1–15.9)
Immature Grans (Abs): 0 10*3/uL (ref 0.0–0.1)
Immature Granulocytes: 0 %
Lymphocytes Absolute: 2.8 10*3/uL (ref 0.7–3.1)
Lymphs: 35 %
MCH: 29.8 pg (ref 26.6–33.0)
MCHC: 33.3 g/dL (ref 31.5–35.7)
MCV: 90 fL (ref 79–97)
Monocytes Absolute: 0.4 10*3/uL (ref 0.1–0.9)
Monocytes: 5 %
Neutrophils Absolute: 4.6 10*3/uL (ref 1.4–7.0)
Neutrophils: 57 %
Platelets: 287 10*3/uL (ref 150–450)
RBC: 4.53 x10E6/uL (ref 3.77–5.28)
RDW: 13 % (ref 12.3–15.4)
WBC: 8 10*3/uL (ref 3.4–10.8)

## 2018-05-23 LAB — TSH: TSH: 1.72 u[IU]/mL (ref 0.450–4.500)

## 2018-05-23 LAB — LIPID PANEL
Chol/HDL Ratio: 5.7 ratio — ABNORMAL HIGH (ref 0.0–4.4)
Cholesterol, Total: 264 mg/dL — ABNORMAL HIGH (ref 100–199)
HDL: 46 mg/dL (ref >39)
LDL Calculated: 172 mg/dL — ABNORMAL HIGH (ref 0–99)
Triglycerides: 230 mg/dL — ABNORMAL HIGH (ref 0–149)
VLDL Cholesterol Cal: 46 mg/dL — ABNORMAL HIGH (ref 5–40)

## 2018-06-05 ENCOUNTER — Encounter: Payer: Self-pay | Admitting: Gastroenterology

## 2018-06-18 MED FILL — LEVOCETIRIZINE 5 MG TABLET: 5 | 30 days supply | Qty: 30 | Fill #3

## 2018-06-18 MED FILL — FLUTICASONE PROP 50 MCG SPR: 50 | 60 days supply | Qty: 16 | Fill #0

## 2018-07-16 MED FILL — LEVOCETIRIZINE 5 MG TABLET: 5 | 30 days supply | Qty: 30 | Fill #4

## 2018-07-16 MED FILL — SERTRALINE HCL 100 MG TAB: 100 | 90 days supply | Qty: 90 | Fill #0

## 2018-08-07 ENCOUNTER — Ambulatory Visit: Payer: 59 | Admitting: Gastroenterology

## 2018-08-12 MED FILL — MONTELUKAST SOD 10 MG TAB: 10 | 90 days supply | Qty: 90 | Fill #1

## 2018-08-18 MED FILL — LEVOCETIRIZINE 5 MG TABLET: 5 | 30 days supply | Qty: 30 | Fill #5

## 2018-08-18 MED FILL — AZELASTINE HCL 137 MCG SPRY: 0.1 | 90 days supply | Qty: 30 | Fill #0

## 2018-08-19 DIAGNOSIS — N200 Calculus of kidney: Secondary | ICD-10-CM | POA: Diagnosis not present

## 2018-09-15 MED FILL — LEVOCETIRIZINE 5 MG TABLET: 5 | 90 days supply | Qty: 90 | Fill #0

## 2018-09-15 MED FILL — FLUTICASONE PROP 50 MCG SPR: 50 | 60 days supply | Qty: 16 | Fill #1

## 2018-10-20 MED FILL — SERTRALINE HCL 100 MG TAB: 100 | 90 days supply | Qty: 90 | Fill #1

## 2018-11-03 MED FILL — MONTELUKAST SOD 10 MG TAB: 10 | 90 days supply | Qty: 90 | Fill #0

## 2018-11-07 ENCOUNTER — Other Ambulatory Visit: Payer: Self-pay | Admitting: Family Medicine

## 2018-11-09 MED FILL — FLUTICASONE PROP 50 MCG SPR: 50 | 60 days supply | Qty: 16 | Fill #2

## 2018-11-10 MED FILL — AZELASTINE HCL 137 MCG SPRY: 0.1 | 90 days supply | Qty: 30 | Fill #1

## 2018-11-20 MED FILL — AZELASTINE HCL 137 MCG SPRY: 0.1 | 90 days supply | Qty: 30 | Fill #1

## 2018-12-08 MED FILL — LEVOCETIRIZINE 5 MG TABLET: 5 | 90 days supply | Qty: 90 | Fill #1

## 2019-01-07 MED FILL — FLUTICASONE PROP 50 MCG SPR: 50 | 90 days supply | Qty: 48 | Fill #0

## 2019-01-13 MED FILL — SERTRALINE HCL 100 MG TAB: 100 | 90 days supply | Qty: 90 | Fill #2

## 2019-01-15 DIAGNOSIS — N200 Calculus of kidney: Secondary | ICD-10-CM | POA: Diagnosis not present

## 2019-02-08 ENCOUNTER — Other Ambulatory Visit: Payer: Self-pay

## 2019-02-08 ENCOUNTER — Other Ambulatory Visit: Payer: Self-pay | Admitting: Podiatry

## 2019-02-08 ENCOUNTER — Ambulatory Visit (INDEPENDENT_AMBULATORY_CARE_PROVIDER_SITE_OTHER): Payer: 59

## 2019-02-08 ENCOUNTER — Ambulatory Visit: Payer: 59 | Admitting: Podiatry

## 2019-02-08 ENCOUNTER — Encounter: Payer: Self-pay | Admitting: Podiatry

## 2019-02-08 DIAGNOSIS — M779 Enthesopathy, unspecified: Secondary | ICD-10-CM

## 2019-02-08 DIAGNOSIS — N200 Calculus of kidney: Secondary | ICD-10-CM | POA: Diagnosis not present

## 2019-02-08 DIAGNOSIS — M7741 Metatarsalgia, right foot: Secondary | ICD-10-CM

## 2019-02-08 DIAGNOSIS — M7751 Other enthesopathy of right foot: Secondary | ICD-10-CM

## 2019-02-08 DIAGNOSIS — M79671 Pain in right foot: Secondary | ICD-10-CM

## 2019-02-08 DIAGNOSIS — M778 Other enthesopathies, not elsewhere classified: Secondary | ICD-10-CM

## 2019-02-08 DIAGNOSIS — M2141 Flat foot [pes planus] (acquired), right foot: Secondary | ICD-10-CM | POA: Diagnosis not present

## 2019-02-08 MED ORDER — METHYLPREDNISOLONE 4 MG PO TBPK: ORAL_TABLET | ORAL | 0 refills | Status: DC

## 2019-02-08 MED ORDER — MELOXICAM 15 MG PO TABS: 15.0000 mg | ORAL_TABLET | Freq: Every day | ORAL | 1 refills | Status: DC

## 2019-02-08 MED FILL — METHYLPREDNISOLONE 4 MG TBP: 4 | 6 days supply | Qty: 21 | Fill #0

## 2019-02-08 MED FILL — MELOXICAM 15 MG TABLET: 15 | 30 days supply | Qty: 30 | Fill #0

## 2019-02-09 MED FILL — MONTELUKAST SOD 10 MG TAB: 10 | 90 days supply | Qty: 90 | Fill #1

## 2019-02-10 NOTE — Progress Notes (Signed)
   Subjective:  43 y.o. female presenting today as a new patient with a chief complaint of sharp pain to the dorsolateral, plantar forefoot and hallux of the right foot that began about one year ago. She reports associated dull aching pain throughout the day and stiffness in the great toe. She has not had any treatment for the symptoms. Being on her feet all day increases the pain. Patient is here for further evaluation and treatment.   Past Medical History:  Diagnosis Date  . Allergy   . Anxiety   . Depression   . History of kidney stones   . Hyperlipidemia        Objective/Physical Exam General: The patient is alert and oriented x3 in no acute distress.  Dermatology: Skin is warm, dry and supple bilateral lower extremities. Negative for open lesions or macerations.  Vascular: Palpable pedal pulses bilaterally. No edema or erythema noted. Capillary refill within normal limits.  Neurological: Epicritic and protective threshold grossly intact bilaterally.   Musculoskeletal Exam: Range of motion within normal limits to all pedal and ankle joints bilateral. Pain with palpation noted to the right forefoot and metatarsal heads of the right foot. Muscle strength 5/5 in all groups bilateral.  Upon weightbearing there is a medial longitudinal arch collapse bilaterally. Remove foot valgus noted to the bilateral lower extremities with excessive pronation upon mid stance.  Radiographic Exam:  Normal osseous mineralization. Joint spaces preserved. No fracture/dislocation/boney destruction.   Pes planus noted on radiographic exam lateral views. Decreased calcaneal inclination and metatarsal declination angle is noted. Anterior break in the cyma line noted on lateral views. Medial talar head to deviation noted on AP radiograph.   Assessment: 1. pes planus bilateral 2. Capsulitis right forefoot 3. Metatarsalgia right forefoot 4. Generalized stiffness right forefoot   Plan of Care:  1.  Patient was evaluated. X-Rays reviewed.  2. Appointment with Liliane Channel, Pedorthist, for custom molded orthotics.  3. Physical therapy ordered for three times weekly for four weeks.  4. Prescription for Medrol Dose Pak provided to patient. 5. Prescription for Meloxicam provided to patient. 6. Return to clinic in 4 weeks.   [employer] employee. Works in vascular lab.    Edrick Kins, DPM [facility]  Dr. Edrick Kins, [address]                                        [place], [place] [address]                Office [phone]  Fax [phone]

## 2019-02-12 MED FILL — AZELASTINE HCL 137 MCG SPRY: 0.1 | 90 days supply | Qty: 30 | Fill #2

## 2019-02-22 DIAGNOSIS — M79671 Pain in right foot: Secondary | ICD-10-CM | POA: Diagnosis not present

## 2019-02-22 DIAGNOSIS — M6281 Muscle weakness (generalized): Secondary | ICD-10-CM | POA: Diagnosis not present

## 2019-02-22 DIAGNOSIS — M79674 Pain in right toe(s): Secondary | ICD-10-CM | POA: Diagnosis not present

## 2019-02-22 DIAGNOSIS — M25671 Stiffness of right ankle, not elsewhere classified: Secondary | ICD-10-CM | POA: Diagnosis not present

## 2019-02-22 DIAGNOSIS — R262 Difficulty in walking, not elsewhere classified: Secondary | ICD-10-CM | POA: Diagnosis not present

## 2019-03-01 DIAGNOSIS — R262 Difficulty in walking, not elsewhere classified: Secondary | ICD-10-CM | POA: Diagnosis not present

## 2019-03-01 DIAGNOSIS — M6281 Muscle weakness (generalized): Secondary | ICD-10-CM | POA: Diagnosis not present

## 2019-03-01 DIAGNOSIS — M25671 Stiffness of right ankle, not elsewhere classified: Secondary | ICD-10-CM | POA: Diagnosis not present

## 2019-03-01 DIAGNOSIS — M79671 Pain in right foot: Secondary | ICD-10-CM | POA: Diagnosis not present

## 2019-03-01 DIAGNOSIS — M79674 Pain in right toe(s): Secondary | ICD-10-CM | POA: Diagnosis not present

## 2019-03-03 DIAGNOSIS — R262 Difficulty in walking, not elsewhere classified: Secondary | ICD-10-CM | POA: Diagnosis not present

## 2019-03-03 DIAGNOSIS — M25671 Stiffness of right ankle, not elsewhere classified: Secondary | ICD-10-CM | POA: Diagnosis not present

## 2019-03-03 DIAGNOSIS — M6281 Muscle weakness (generalized): Secondary | ICD-10-CM | POA: Diagnosis not present

## 2019-03-03 DIAGNOSIS — M79671 Pain in right foot: Secondary | ICD-10-CM | POA: Diagnosis not present

## 2019-03-03 DIAGNOSIS — M79674 Pain in right toe(s): Secondary | ICD-10-CM | POA: Diagnosis not present

## 2019-03-05 MED FILL — MELOXICAM 15 MG TABLET: 15 | 30 days supply | Qty: 30 | Fill #1

## 2019-03-08 DIAGNOSIS — M25671 Stiffness of right ankle, not elsewhere classified: Secondary | ICD-10-CM | POA: Diagnosis not present

## 2019-03-08 DIAGNOSIS — M6281 Muscle weakness (generalized): Secondary | ICD-10-CM | POA: Diagnosis not present

## 2019-03-08 DIAGNOSIS — R262 Difficulty in walking, not elsewhere classified: Secondary | ICD-10-CM | POA: Diagnosis not present

## 2019-03-08 DIAGNOSIS — M79671 Pain in right foot: Secondary | ICD-10-CM | POA: Diagnosis not present

## 2019-03-08 DIAGNOSIS — M79674 Pain in right toe(s): Secondary | ICD-10-CM | POA: Diagnosis not present

## 2019-03-10 ENCOUNTER — Ambulatory Visit: Payer: 59 | Admitting: Podiatry

## 2019-03-10 ENCOUNTER — Other Ambulatory Visit: Payer: Self-pay

## 2019-03-10 DIAGNOSIS — M7741 Metatarsalgia, right foot: Secondary | ICD-10-CM

## 2019-03-10 DIAGNOSIS — M779 Enthesopathy, unspecified: Secondary | ICD-10-CM | POA: Diagnosis not present

## 2019-03-10 DIAGNOSIS — M79671 Pain in right foot: Secondary | ICD-10-CM | POA: Diagnosis not present

## 2019-03-10 DIAGNOSIS — M79674 Pain in right toe(s): Secondary | ICD-10-CM | POA: Diagnosis not present

## 2019-03-10 DIAGNOSIS — M778 Other enthesopathies, not elsewhere classified: Secondary | ICD-10-CM

## 2019-03-10 DIAGNOSIS — M25671 Stiffness of right ankle, not elsewhere classified: Secondary | ICD-10-CM | POA: Diagnosis not present

## 2019-03-10 DIAGNOSIS — M6281 Muscle weakness (generalized): Secondary | ICD-10-CM | POA: Diagnosis not present

## 2019-03-10 DIAGNOSIS — R262 Difficulty in walking, not elsewhere classified: Secondary | ICD-10-CM | POA: Diagnosis not present

## 2019-03-10 MED ORDER — IBUPROFEN 800 MG PO TABS: 800.0000 mg | ORAL_TABLET | Freq: Three times a day (TID) | ORAL | 1 refills | Status: DC | PRN

## 2019-03-10 MED FILL — LEVOCETIRIZINE 5 MG TABLET: 5 | 90 days supply | Qty: 90 | Fill #2

## 2019-03-10 MED FILL — IBUPROFEN 800 MG TAB: 800 | 30 days supply | Qty: 90 | Fill #0

## 2019-03-11 ENCOUNTER — Telehealth: Payer: Self-pay | Admitting: *Deleted

## 2019-03-11 NOTE — Progress Notes (Signed)
   Subjective:  43 y.o. female presenting today She states she is doing better. She states the physical therapy has been helping alleviate her symptoms. She has taken the Medrol Dose Pak and states that is also helped relieve the symptoms. She states the Meloxicam caused stomach upset. She denies any worsening factors at this time. Patient is here for further evaluation and treatment.   Past Medical History:  Diagnosis Date  . Allergy   . Anxiety   . Depression   . History of kidney stones   . Hyperlipidemia        Objective/Physical Exam General: The patient is alert and oriented x3 in no acute distress.  Dermatology: Skin is warm, dry and supple bilateral lower extremities. Negative for open lesions or macerations.  Vascular: Palpable pedal pulses bilaterally. No edema or erythema noted. Capillary refill within normal limits.  Neurological: Epicritic and protective threshold grossly intact bilaterally.   Musculoskeletal Exam: Range of motion within normal limits to all pedal and ankle joints bilateral. Pain with palpation noted to the right forefoot and metatarsal heads of the right foot. Muscle strength 5/5 in all groups bilateral.  Upon weightbearing there is a medial longitudinal arch collapse bilaterally. Remove foot valgus noted to the bilateral lower extremities with excessive pronation upon mid stance.  Assessment: 1. pes planus bilateral 2. Capsulitis right forefoot - improved 3. Metatarsalgia right forefoot - improved  4. Generalized stiffness right forefoot - improved    Plan of Care:  1. Patient was evaluated.  2. Continue physical therapy twice weekly.  3. Discontinue taking Meloxicam due to GI upset.  4. Prescription for Motrin 800 mg provided to patient. Patient known to tolerate this.  5. Appointment with Liliane Channel, Pedorthist, for custom molded orthotics. Patient has not made an appointment yet.  6. Return to clinic as needed.   [employer] employee. Works in vascular  lab.    Edrick Kins, DPM [facility]  Dr. Edrick Kins, [place]                                        [place],  [address]                Office [phone]  Fax [phone]

## 2019-03-11 NOTE — Telephone Encounter (Signed)
Lori Fisher asked if the the Meloxicam was to be discontinued because new orders for ibuprofen given 03/10/2019. I told Brayton Layman to discontinue the Meloxicam.

## 2019-03-15 DIAGNOSIS — R262 Difficulty in walking, not elsewhere classified: Secondary | ICD-10-CM | POA: Diagnosis not present

## 2019-03-15 DIAGNOSIS — M25671 Stiffness of right ankle, not elsewhere classified: Secondary | ICD-10-CM | POA: Diagnosis not present

## 2019-03-15 DIAGNOSIS — M6281 Muscle weakness (generalized): Secondary | ICD-10-CM | POA: Diagnosis not present

## 2019-03-15 DIAGNOSIS — M79674 Pain in right toe(s): Secondary | ICD-10-CM | POA: Diagnosis not present

## 2019-03-15 DIAGNOSIS — M79671 Pain in right foot: Secondary | ICD-10-CM | POA: Diagnosis not present

## 2019-03-17 DIAGNOSIS — M79674 Pain in right toe(s): Secondary | ICD-10-CM | POA: Diagnosis not present

## 2019-03-17 DIAGNOSIS — R262 Difficulty in walking, not elsewhere classified: Secondary | ICD-10-CM | POA: Diagnosis not present

## 2019-03-17 DIAGNOSIS — M79671 Pain in right foot: Secondary | ICD-10-CM | POA: Diagnosis not present

## 2019-03-17 DIAGNOSIS — M25671 Stiffness of right ankle, not elsewhere classified: Secondary | ICD-10-CM | POA: Diagnosis not present

## 2019-03-17 DIAGNOSIS — M6281 Muscle weakness (generalized): Secondary | ICD-10-CM | POA: Diagnosis not present

## 2019-03-18 ENCOUNTER — Other Ambulatory Visit: Payer: 59 | Admitting: Orthotics

## 2019-03-19 MED FILL — predniSONE 10 MG TABS: 10 | 10 days supply | Qty: 21 | Fill #0

## 2019-03-22 DIAGNOSIS — M6281 Muscle weakness (generalized): Secondary | ICD-10-CM | POA: Diagnosis not present

## 2019-03-22 DIAGNOSIS — M79674 Pain in right toe(s): Secondary | ICD-10-CM | POA: Diagnosis not present

## 2019-03-22 DIAGNOSIS — M79671 Pain in right foot: Secondary | ICD-10-CM | POA: Diagnosis not present

## 2019-03-22 DIAGNOSIS — M25671 Stiffness of right ankle, not elsewhere classified: Secondary | ICD-10-CM | POA: Diagnosis not present

## 2019-03-22 DIAGNOSIS — R262 Difficulty in walking, not elsewhere classified: Secondary | ICD-10-CM | POA: Diagnosis not present

## 2019-03-24 DIAGNOSIS — M25671 Stiffness of right ankle, not elsewhere classified: Secondary | ICD-10-CM | POA: Diagnosis not present

## 2019-03-24 DIAGNOSIS — M6281 Muscle weakness (generalized): Secondary | ICD-10-CM | POA: Diagnosis not present

## 2019-03-24 DIAGNOSIS — M79671 Pain in right foot: Secondary | ICD-10-CM | POA: Diagnosis not present

## 2019-03-24 DIAGNOSIS — R262 Difficulty in walking, not elsewhere classified: Secondary | ICD-10-CM | POA: Diagnosis not present

## 2019-03-24 DIAGNOSIS — M79674 Pain in right toe(s): Secondary | ICD-10-CM | POA: Diagnosis not present

## 2019-03-25 ENCOUNTER — Other Ambulatory Visit: Payer: Self-pay

## 2019-03-25 ENCOUNTER — Ambulatory Visit (INDEPENDENT_AMBULATORY_CARE_PROVIDER_SITE_OTHER): Payer: 59 | Admitting: Orthotics

## 2019-03-25 DIAGNOSIS — M2141 Flat foot [pes planus] (acquired), right foot: Secondary | ICD-10-CM

## 2019-03-25 DIAGNOSIS — M778 Other enthesopathies, not elsewhere classified: Secondary | ICD-10-CM

## 2019-03-25 DIAGNOSIS — M7741 Metatarsalgia, right foot: Secondary | ICD-10-CM

## 2019-03-25 NOTE — Progress Notes (Signed)
Patient is being seen today for f/o to address congential pes planus/pes planovalgus. Patient is active youth and demonstrates over pronation in gait, prominent medially shifted talus, and collapse of medial column.  Goals are RF stability, longitudinal arch support, decrease in pronation, and ease of discomfort in mobility related activities.   

## 2019-03-26 ENCOUNTER — Ambulatory Visit: Payer: 59 | Admitting: Family Medicine

## 2019-03-26 DIAGNOSIS — H5213 Myopia, bilateral: Secondary | ICD-10-CM | POA: Diagnosis not present

## 2019-03-29 DIAGNOSIS — M6281 Muscle weakness (generalized): Secondary | ICD-10-CM | POA: Diagnosis not present

## 2019-03-29 DIAGNOSIS — R262 Difficulty in walking, not elsewhere classified: Secondary | ICD-10-CM | POA: Diagnosis not present

## 2019-03-29 DIAGNOSIS — M79671 Pain in right foot: Secondary | ICD-10-CM | POA: Diagnosis not present

## 2019-03-29 DIAGNOSIS — M79674 Pain in right toe(s): Secondary | ICD-10-CM | POA: Diagnosis not present

## 2019-03-29 DIAGNOSIS — M25671 Stiffness of right ankle, not elsewhere classified: Secondary | ICD-10-CM | POA: Diagnosis not present

## 2019-03-31 DIAGNOSIS — R262 Difficulty in walking, not elsewhere classified: Secondary | ICD-10-CM | POA: Diagnosis not present

## 2019-03-31 DIAGNOSIS — M79674 Pain in right toe(s): Secondary | ICD-10-CM | POA: Diagnosis not present

## 2019-03-31 DIAGNOSIS — M79671 Pain in right foot: Secondary | ICD-10-CM | POA: Diagnosis not present

## 2019-03-31 DIAGNOSIS — M6281 Muscle weakness (generalized): Secondary | ICD-10-CM | POA: Diagnosis not present

## 2019-03-31 DIAGNOSIS — M25671 Stiffness of right ankle, not elsewhere classified: Secondary | ICD-10-CM | POA: Diagnosis not present

## 2019-04-01 ENCOUNTER — Ambulatory Visit (INDEPENDENT_AMBULATORY_CARE_PROVIDER_SITE_OTHER): Payer: 59 | Admitting: Family Medicine

## 2019-04-01 ENCOUNTER — Encounter: Payer: Self-pay | Admitting: Family Medicine

## 2019-04-01 DIAGNOSIS — M25521 Pain in right elbow: Secondary | ICD-10-CM

## 2019-04-01 MED ORDER — NITROGLYCERIN 0.1 MG/HR TD PT24: MEDICATED_PATCH | TRANSDERMAL | 3 refills | Status: DC

## 2019-04-01 MED FILL — NITROGLYCERIN 0.1 MG/HR PTC: 0.1 | 28 days supply | Qty: 7 | Fill #0

## 2019-04-01 MED FILL — FLUTICASONE PROP 50 MCG SPR: 50 | 60 days supply | Qty: 16 | Fill #0

## 2019-04-01 NOTE — Progress Notes (Signed)
Office Visit Note   Patient: Lori Fisher           Date of Birth: 1975-06-12           MRN: [id number] Visit Date: 04/01/2019 Requested by: Shawnee Knapp, MD No address on file PCP: Shawnee Knapp, MD  Subjective: Chief Complaint  Patient presents with   Right Elbow - Pain    Pain x 1 month. Works as Clinical research associate. Wearing elbow band and wrist splint - minimal support. Right-hand dominant. No better with a round of prednisone.    HPI: She is here with right elbow pain.  She is right hand dominant, works as a Clinical research associate.  She has been doing this job for about 2 years.  About a month ago she started noticing gradual onset of pain on the lateral elbow while working.  Pain became progressively worse so she went to an employee health doctor who diagnosed tennis elbow.  She has been wearing a tennis elbow strap and a wrist brace but her pain does not seem to be going away.  She has been out of work for the past 2 weeks because of her pain.  Workmen's Comp. denied her claim.  Pain is on the lateral aspect of the elbow.  Occasional pain radiating toward the wrist, occasional numbness but nothing on a regular basis.  She has not noticed any weakness other than give way weakness due to pain.  She is never had problems with her elbow before.  She tried ibuprofen 800 mg and also a course of prednisone but neither of these helped.              ROS: No fevers or chills.  All other systems were reviewed and are negative.  Objective: Vital Signs: There were no vitals taken for this visit.  Physical Exam:  General:  Alert and oriented, in no acute distress. Pulm:  Breathing unlabored. Psy:  Normal mood, congruent affect. Skin: No rash or bruising. Right elbow: Full range of motion pain-free.  Full pronation and supination of the forearm.  She is point tender at the common extensor tendon at the lateral epicondyle.  Mild tenderness near the radial tunnel.  Pain with wrist extension and  third finger extension against resistance, also pain with pronation and supination of the forearm.  Imaging: None today.  Assessment & Plan: 1.  Right elbow pain most likely lateral epicondylitis related to overuse at work.  Cannot rule out radial tunnel syndrome. -We discussed various treatment options and elected to refer her to occupational therapy.  She will also try nitroglycerin patches.  If symptoms do not improve, could do a one-time cortisone injection.  We may also image with ultrasound at that time.     Procedures: No procedures performed  No notes on file     PMFS History: Patient Active Problem List   Diagnosis Date Noted   Class 1 obesity due to excess calories without serious comorbidity with body mass index (BMI) of 32.0 to 32.9 in adult 03/14/2017   Environmental and seasonal allergies 03/14/2017   Generalized anxiety disorder 03/14/2017   Pure hypercholesterolemia 03/14/2017   Past Medical History:  Diagnosis Date   Allergy    Anxiety    Depression    History of kidney stones    Hyperlipidemia     Family History  Problem Relation Age of Onset   Cancer Father    Diabetes Sister     Past Surgical History:  Procedure Laterality Date   EXTRACORPOREAL SHOCK WAVE LITHOTRIPSY Left 04/16/2018   Procedure: EXTRACORPOREAL SHOCK WAVE LITHOTRIPSY (ESWL);  Surgeon: Ardis Hughs, MD;  Location: [facility];  Service: Urology;  Laterality: Left;   LITHOTRIPSY     Social History   Occupational History   Not on file  Tobacco Use   Smoking status: Never Smoker   Smokeless tobacco: Never Used  Substance and Sexual Activity   Alcohol use: Yes    Alcohol/week: 1.0 standard drinks    Types: 1 Glasses of wine per week   Drug use: No   Sexual activity: Not on file

## 2019-04-03 ENCOUNTER — Other Ambulatory Visit: Payer: Self-pay | Admitting: Podiatry

## 2019-04-05 DIAGNOSIS — M25671 Stiffness of right ankle, not elsewhere classified: Secondary | ICD-10-CM | POA: Diagnosis not present

## 2019-04-05 DIAGNOSIS — M79671 Pain in right foot: Secondary | ICD-10-CM | POA: Diagnosis not present

## 2019-04-05 DIAGNOSIS — M79674 Pain in right toe(s): Secondary | ICD-10-CM | POA: Diagnosis not present

## 2019-04-05 DIAGNOSIS — M6281 Muscle weakness (generalized): Secondary | ICD-10-CM | POA: Diagnosis not present

## 2019-04-05 DIAGNOSIS — R262 Difficulty in walking, not elsewhere classified: Secondary | ICD-10-CM | POA: Diagnosis not present

## 2019-04-06 DIAGNOSIS — M25521 Pain in right elbow: Secondary | ICD-10-CM | POA: Diagnosis not present

## 2019-04-06 DIAGNOSIS — M7711 Lateral epicondylitis, right elbow: Secondary | ICD-10-CM | POA: Diagnosis not present

## 2019-04-06 DIAGNOSIS — M70931 Unspecified soft tissue disorder related to use, overuse and pressure, right forearm: Secondary | ICD-10-CM | POA: Diagnosis not present

## 2019-04-06 DIAGNOSIS — M25621 Stiffness of right elbow, not elsewhere classified: Secondary | ICD-10-CM | POA: Diagnosis not present

## 2019-04-06 DIAGNOSIS — M25631 Stiffness of right wrist, not elsewhere classified: Secondary | ICD-10-CM | POA: Diagnosis not present

## 2019-04-06 DIAGNOSIS — R202 Paresthesia of skin: Secondary | ICD-10-CM | POA: Diagnosis not present

## 2019-04-06 DIAGNOSIS — M6281 Muscle weakness (generalized): Secondary | ICD-10-CM | POA: Diagnosis not present

## 2019-04-07 DIAGNOSIS — M79674 Pain in right toe(s): Secondary | ICD-10-CM | POA: Diagnosis not present

## 2019-04-07 DIAGNOSIS — M6281 Muscle weakness (generalized): Secondary | ICD-10-CM | POA: Diagnosis not present

## 2019-04-07 DIAGNOSIS — R262 Difficulty in walking, not elsewhere classified: Secondary | ICD-10-CM | POA: Diagnosis not present

## 2019-04-07 DIAGNOSIS — M79671 Pain in right foot: Secondary | ICD-10-CM | POA: Diagnosis not present

## 2019-04-07 DIAGNOSIS — M25671 Stiffness of right ankle, not elsewhere classified: Secondary | ICD-10-CM | POA: Diagnosis not present

## 2019-04-08 DIAGNOSIS — M25521 Pain in right elbow: Secondary | ICD-10-CM | POA: Diagnosis not present

## 2019-04-08 DIAGNOSIS — M25631 Stiffness of right wrist, not elsewhere classified: Secondary | ICD-10-CM | POA: Diagnosis not present

## 2019-04-08 DIAGNOSIS — M6281 Muscle weakness (generalized): Secondary | ICD-10-CM | POA: Diagnosis not present

## 2019-04-08 DIAGNOSIS — R202 Paresthesia of skin: Secondary | ICD-10-CM | POA: Diagnosis not present

## 2019-04-08 DIAGNOSIS — M25621 Stiffness of right elbow, not elsewhere classified: Secondary | ICD-10-CM | POA: Diagnosis not present

## 2019-04-08 DIAGNOSIS — M70931 Unspecified soft tissue disorder related to use, overuse and pressure, right forearm: Secondary | ICD-10-CM | POA: Diagnosis not present

## 2019-04-08 DIAGNOSIS — M7711 Lateral epicondylitis, right elbow: Secondary | ICD-10-CM | POA: Diagnosis not present

## 2019-04-12 ENCOUNTER — Telehealth: Payer: Self-pay | Admitting: Family Medicine

## 2019-04-12 MED FILL — SERTRALINE HCL 100 MG TAB: 100 | 90 days supply | Qty: 90 | Fill #3

## 2019-04-12 NOTE — Telephone Encounter (Signed)
Received vm from patient inquiring if we received fmla forms. IC her back, lmam advised [employer] has forms and mailed her packet on 10/30 advising need to sign release and pay $25. I also gave her [employer] ph number

## 2019-04-13 ENCOUNTER — Encounter: Payer: Self-pay | Admitting: Family Medicine

## 2019-04-13 DIAGNOSIS — M6281 Muscle weakness (generalized): Secondary | ICD-10-CM | POA: Diagnosis not present

## 2019-04-13 DIAGNOSIS — M79674 Pain in right toe(s): Secondary | ICD-10-CM | POA: Diagnosis not present

## 2019-04-13 DIAGNOSIS — R202 Paresthesia of skin: Secondary | ICD-10-CM | POA: Diagnosis not present

## 2019-04-13 DIAGNOSIS — M25521 Pain in right elbow: Secondary | ICD-10-CM | POA: Diagnosis not present

## 2019-04-13 DIAGNOSIS — M79671 Pain in right foot: Secondary | ICD-10-CM | POA: Diagnosis not present

## 2019-04-13 DIAGNOSIS — M25671 Stiffness of right ankle, not elsewhere classified: Secondary | ICD-10-CM | POA: Diagnosis not present

## 2019-04-13 DIAGNOSIS — R262 Difficulty in walking, not elsewhere classified: Secondary | ICD-10-CM | POA: Diagnosis not present

## 2019-04-13 DIAGNOSIS — M7711 Lateral epicondylitis, right elbow: Secondary | ICD-10-CM | POA: Diagnosis not present

## 2019-04-13 DIAGNOSIS — M25621 Stiffness of right elbow, not elsewhere classified: Secondary | ICD-10-CM | POA: Diagnosis not present

## 2019-04-13 DIAGNOSIS — M70931 Unspecified soft tissue disorder related to use, overuse and pressure, right forearm: Secondary | ICD-10-CM | POA: Diagnosis not present

## 2019-04-13 DIAGNOSIS — M25631 Stiffness of right wrist, not elsewhere classified: Secondary | ICD-10-CM | POA: Diagnosis not present

## 2019-04-15 DIAGNOSIS — M7711 Lateral epicondylitis, right elbow: Secondary | ICD-10-CM | POA: Diagnosis not present

## 2019-04-15 DIAGNOSIS — R262 Difficulty in walking, not elsewhere classified: Secondary | ICD-10-CM | POA: Diagnosis not present

## 2019-04-15 DIAGNOSIS — M25621 Stiffness of right elbow, not elsewhere classified: Secondary | ICD-10-CM | POA: Diagnosis not present

## 2019-04-15 DIAGNOSIS — M25521 Pain in right elbow: Secondary | ICD-10-CM | POA: Diagnosis not present

## 2019-04-15 DIAGNOSIS — M79674 Pain in right toe(s): Secondary | ICD-10-CM | POA: Diagnosis not present

## 2019-04-15 DIAGNOSIS — M70931 Unspecified soft tissue disorder related to use, overuse and pressure, right forearm: Secondary | ICD-10-CM | POA: Diagnosis not present

## 2019-04-15 DIAGNOSIS — M79671 Pain in right foot: Secondary | ICD-10-CM | POA: Diagnosis not present

## 2019-04-15 DIAGNOSIS — M25631 Stiffness of right wrist, not elsewhere classified: Secondary | ICD-10-CM | POA: Diagnosis not present

## 2019-04-15 DIAGNOSIS — M6281 Muscle weakness (generalized): Secondary | ICD-10-CM | POA: Diagnosis not present

## 2019-04-15 DIAGNOSIS — R202 Paresthesia of skin: Secondary | ICD-10-CM | POA: Diagnosis not present

## 2019-04-15 DIAGNOSIS — M25671 Stiffness of right ankle, not elsewhere classified: Secondary | ICD-10-CM | POA: Diagnosis not present

## 2019-04-19 ENCOUNTER — Other Ambulatory Visit: Payer: Self-pay | Admitting: Family Medicine

## 2019-04-19 ENCOUNTER — Telehealth: Payer: 59 | Admitting: Family

## 2019-04-19 DIAGNOSIS — M25521 Pain in right elbow: Secondary | ICD-10-CM

## 2019-04-19 DIAGNOSIS — Z20822 Contact with and (suspected) exposure to covid-19: Secondary | ICD-10-CM

## 2019-04-19 DIAGNOSIS — Z20828 Contact with and (suspected) exposure to other viral communicable diseases: Secondary | ICD-10-CM

## 2019-04-19 NOTE — Progress Notes (Signed)
E-Visit for Corona Virus Screening   Your current symptoms could be consistent with the coronavirus.  Many health care providers can now test patients at their office but not all are.  [facility] has multiple testing sites. For information on our COVID testing locations and hours go to [email or web address]  Please quarantine yourself while awaiting your test results.  We are enrolling you in our [place] for [place] . Daily you will receive a questionnaire within the [facility] website. Our COVID 19 response team willl be monitoriing your responses daily.  You can go to one of the testing sites listed below, while they are opened (see hours). You do not need an order and will stay in your car during the test. You do need to self isolate until your results return and if positive 10 days from when your symptoms started and until you are 3 days fever free.   Testing Locations (Monday - Friday, 10 a.m. - 3 p.m.) . [place]: [facility] at [facility], [address] [place], [place]: [place], [place], [place], [place] (entrance off [address])  . [facility]: (Closed each Monday): Testing site relocated to the short stay covered drive at [facility]. (Use the [facility] entrance to [facility] next to [place] is a respiratory illness with symptoms that are similar to the flu. Symptoms are typically mild to moderate, but there have been cases of severe illness and death due to the virus. The following symptoms may appear 2-14 days after exposure: . Fever . Cough . Shortness of breath or difficulty breathing . Chills . Repeated shaking with chills . Muscle pain . Headache . Sore throat . New loss of taste or smell . Fatigue . Congestion or runny nose . Nausea or vomiting . Diarrhea  It is vitally important that if you feel that you have  an infection such as this virus or any other virus that you stay home and away from places where you may spread it to others.  You should self-quarantine for 14 days if you have symptoms that could potentially be coronavirus or have been in close contact a with a person diagnosed with COVID-19 within the last 2 weeks. You should avoid contact with people age 75 and older.   You should wear a mask or cloth face covering over your nose and mouth if you must be around other people or animals, including pets (even at home). Try to stay at least 6 feet away from other people. This will protect the people around you.    You may also take acetaminophen (Tylenol) as needed for fever.   Reduce your risk of any infection by using the same precautions used for avoiding the common cold or flu:  Marland Kitchen Wash your hands often with soap and warm water for at least 20 seconds.  If soap and water are not readily available, use an alcohol-based hand sanitizer with at least 60% alcohol.  . If coughing or sneezing, cover your mouth and nose by coughing or sneezing into the elbow areas of your shirt or coat, into a tissue or into your sleeve (not your hands). . Avoid shaking hands with others and consider head nods or verbal greetings only. . Avoid touching your eyes, nose, or mouth with unwashed hands.  . Avoid close contact with people who are sick. . Avoid places or events with large numbers of  people in one location, like concerts or sporting events. . Carefully consider travel plans you have or are making. . If you are planning any travel outside or inside the [place], visit the CDC's Travelers' Health webpage for the latest health notices. . If you have some symptoms but not all symptoms, continue to monitor at home and seek medical attention if your symptoms worsen. . If you are having a medical emergency, call 911.  HOME CARE . Only take medications as instructed by your medical team. . Drink plenty of fluids and get  plenty of rest. . A steam or ultrasonic humidifier can help if you have congestion.   GET HELP RIGHT AWAY IF YOU HAVE EMERGENCY WARNING SIGNS** FOR COVID-19. If you or someone is showing any of these signs seek emergency medical care immediately. Call 911 or proceed to your closest emergency facility if: . You develop worsening high fever. . Trouble breathing . Bluish lips or face . Persistent pain or pressure in the chest . New confusion . Inability to wake or stay awake . You cough up blood. . Your symptoms become more severe  **This list is not all possible symptoms. Contact your medical provider for any symptoms that are sever or concerning to you.   MAKE SURE YOU   Understand these instructions.  Will watch your condition.  Will get help right away if you are not doing well or get worse.  Your e-visit answers were reviewed by a board certified advanced clinical practitioner to complete your personal care plan.  Depending on the condition, your plan could have included both over the counter or prescription medications.  If there is a problem please reply once you have received a response from your provider.  Your safety is important to [place].  If you have drug allergies check your prescription carefully.    You can use MyChart to ask questions about today's visit, request a non-urgent call back, or ask for a work or school excuse for 24 hours related to this e-Visit. If it has been greater than 24 hours you will need to follow up with your provider, or enter a new e-Visit to address those concerns. You will get an e-mail in the next two days asking about your experience.  I hope that your e-visit has been valuable and will speed your recovery. Thank you for using e-visits.  Approximately 5 minutes was spent documenting and reviewing patient's chart.

## 2019-04-20 ENCOUNTER — Other Ambulatory Visit: Payer: Self-pay

## 2019-04-20 DIAGNOSIS — Z20822 Contact with and (suspected) exposure to covid-19: Secondary | ICD-10-CM

## 2019-04-21 LAB — NOVEL CORONAVIRUS, NAA: SARS-CoV-2, NAA: NOT DETECTED

## 2019-04-26 DIAGNOSIS — M7711 Lateral epicondylitis, right elbow: Secondary | ICD-10-CM | POA: Diagnosis not present

## 2019-04-26 DIAGNOSIS — M25671 Stiffness of right ankle, not elsewhere classified: Secondary | ICD-10-CM | POA: Diagnosis not present

## 2019-04-26 DIAGNOSIS — R262 Difficulty in walking, not elsewhere classified: Secondary | ICD-10-CM | POA: Diagnosis not present

## 2019-04-26 DIAGNOSIS — M25521 Pain in right elbow: Secondary | ICD-10-CM | POA: Diagnosis not present

## 2019-04-26 DIAGNOSIS — M25631 Stiffness of right wrist, not elsewhere classified: Secondary | ICD-10-CM | POA: Diagnosis not present

## 2019-04-26 DIAGNOSIS — R202 Paresthesia of skin: Secondary | ICD-10-CM | POA: Diagnosis not present

## 2019-04-26 DIAGNOSIS — M79671 Pain in right foot: Secondary | ICD-10-CM | POA: Diagnosis not present

## 2019-04-26 DIAGNOSIS — M25621 Stiffness of right elbow, not elsewhere classified: Secondary | ICD-10-CM | POA: Diagnosis not present

## 2019-04-26 DIAGNOSIS — M79674 Pain in right toe(s): Secondary | ICD-10-CM | POA: Diagnosis not present

## 2019-04-26 DIAGNOSIS — M6281 Muscle weakness (generalized): Secondary | ICD-10-CM | POA: Diagnosis not present

## 2019-04-26 DIAGNOSIS — M70931 Unspecified soft tissue disorder related to use, overuse and pressure, right forearm: Secondary | ICD-10-CM | POA: Diagnosis not present

## 2019-04-28 DIAGNOSIS — M79674 Pain in right toe(s): Secondary | ICD-10-CM | POA: Diagnosis not present

## 2019-04-28 DIAGNOSIS — M25521 Pain in right elbow: Secondary | ICD-10-CM | POA: Diagnosis not present

## 2019-04-28 DIAGNOSIS — R262 Difficulty in walking, not elsewhere classified: Secondary | ICD-10-CM | POA: Diagnosis not present

## 2019-04-28 DIAGNOSIS — M6281 Muscle weakness (generalized): Secondary | ICD-10-CM | POA: Diagnosis not present

## 2019-04-28 DIAGNOSIS — M70931 Unspecified soft tissue disorder related to use, overuse and pressure, right forearm: Secondary | ICD-10-CM | POA: Diagnosis not present

## 2019-04-28 DIAGNOSIS — R202 Paresthesia of skin: Secondary | ICD-10-CM | POA: Diagnosis not present

## 2019-04-28 DIAGNOSIS — M25631 Stiffness of right wrist, not elsewhere classified: Secondary | ICD-10-CM | POA: Diagnosis not present

## 2019-04-28 DIAGNOSIS — M79671 Pain in right foot: Secondary | ICD-10-CM | POA: Diagnosis not present

## 2019-04-28 DIAGNOSIS — M25671 Stiffness of right ankle, not elsewhere classified: Secondary | ICD-10-CM | POA: Diagnosis not present

## 2019-04-28 DIAGNOSIS — M25621 Stiffness of right elbow, not elsewhere classified: Secondary | ICD-10-CM | POA: Diagnosis not present

## 2019-04-28 DIAGNOSIS — M7711 Lateral epicondylitis, right elbow: Secondary | ICD-10-CM | POA: Diagnosis not present

## 2019-05-03 ENCOUNTER — Ambulatory Visit: Payer: 59 | Admitting: Orthotics

## 2019-05-03 ENCOUNTER — Other Ambulatory Visit: Payer: Self-pay

## 2019-05-03 DIAGNOSIS — M778 Other enthesopathies, not elsewhere classified: Secondary | ICD-10-CM

## 2019-05-03 DIAGNOSIS — M7741 Metatarsalgia, right foot: Secondary | ICD-10-CM

## 2019-05-03 NOTE — Progress Notes (Signed)
Patient came in today to pick up custom made foot orthotics.  The goals were accomplished and the patient reported no dissatisfaction with said orthotics.  Patient was advised of breakin period and how to report any issues. 

## 2019-05-04 ENCOUNTER — Encounter: Payer: Self-pay | Admitting: Family Medicine

## 2019-05-04 DIAGNOSIS — M6281 Muscle weakness (generalized): Secondary | ICD-10-CM | POA: Diagnosis not present

## 2019-05-04 DIAGNOSIS — M25521 Pain in right elbow: Secondary | ICD-10-CM | POA: Diagnosis not present

## 2019-05-04 DIAGNOSIS — M70931 Unspecified soft tissue disorder related to use, overuse and pressure, right forearm: Secondary | ICD-10-CM | POA: Diagnosis not present

## 2019-05-04 DIAGNOSIS — M25621 Stiffness of right elbow, not elsewhere classified: Secondary | ICD-10-CM | POA: Diagnosis not present

## 2019-05-04 DIAGNOSIS — M25631 Stiffness of right wrist, not elsewhere classified: Secondary | ICD-10-CM | POA: Diagnosis not present

## 2019-05-04 DIAGNOSIS — M7711 Lateral epicondylitis, right elbow: Secondary | ICD-10-CM | POA: Diagnosis not present

## 2019-05-04 DIAGNOSIS — R202 Paresthesia of skin: Secondary | ICD-10-CM | POA: Diagnosis not present

## 2019-05-05 ENCOUNTER — Encounter: Payer: Self-pay | Admitting: Family Medicine

## 2019-05-05 DIAGNOSIS — M79674 Pain in right toe(s): Secondary | ICD-10-CM | POA: Diagnosis not present

## 2019-05-05 DIAGNOSIS — M25671 Stiffness of right ankle, not elsewhere classified: Secondary | ICD-10-CM | POA: Diagnosis not present

## 2019-05-05 DIAGNOSIS — M6281 Muscle weakness (generalized): Secondary | ICD-10-CM | POA: Diagnosis not present

## 2019-05-05 DIAGNOSIS — R262 Difficulty in walking, not elsewhere classified: Secondary | ICD-10-CM | POA: Diagnosis not present

## 2019-05-05 DIAGNOSIS — M79671 Pain in right foot: Secondary | ICD-10-CM | POA: Diagnosis not present

## 2019-05-12 DIAGNOSIS — H35411 Lattice degeneration of retina, right eye: Secondary | ICD-10-CM | POA: Diagnosis not present

## 2019-05-12 DIAGNOSIS — H40013 Open angle with borderline findings, low risk, bilateral: Secondary | ICD-10-CM | POA: Diagnosis not present

## 2019-05-13 ENCOUNTER — Encounter: Payer: Self-pay | Admitting: Family Medicine

## 2019-05-14 MED FILL — AZELASTINE HCL 137 MCG SPRY: 0.1 | 90 days supply | Qty: 30 | Fill #3

## 2019-05-19 ENCOUNTER — Encounter: Payer: Self-pay | Admitting: Family Medicine

## 2019-05-19 ENCOUNTER — Ambulatory Visit (INDEPENDENT_AMBULATORY_CARE_PROVIDER_SITE_OTHER): Payer: 59 | Admitting: Family Medicine

## 2019-05-19 ENCOUNTER — Other Ambulatory Visit: Payer: Self-pay

## 2019-05-19 DIAGNOSIS — M25521 Pain in right elbow: Secondary | ICD-10-CM | POA: Diagnosis not present

## 2019-05-19 NOTE — Progress Notes (Signed)
   Office Visit Note   Patient: Lori Fisher           Date of Birth: [birth year]           MRN: [id number] Visit Date: 05/19/2019 Requested by: Shawnee Knapp, MD [place],  [place] [id number] PCP: Shawnee Knapp, MD  Subjective: Chief Complaint  Patient presents with  . Right Elbow - Pain    Been doing the OT and dry needling, but has run out of authorized visits, so therapy has been interrupted x 2 weeks. Wore wrist splint x 3 weeks, but wrist now hurts.    HPI: She is here for follow-up right elbow pain.  She has been doing occupational therapy with dry needling and other modalities, but she ran out of her authorized visits so she is waiting to get approval for more.  She was supposed to go back to work this coming Monday but her elbow is still hurting quite a bit and she does not feel like she is ready.              ROS:   All other systems were reviewed and are negative.  Objective: Vital Signs: There were no vitals taken for this visit.  Physical Exam:  General:  Alert and oriented, in no acute distress. Pulm:  Breathing unlabored. Psy:  Normal mood, congruent affect. Skin: No rash or bruising. Right elbow: Full active range of motion, point tender at the common extensor tendon at the lateral condyle.  Mild tenderness at the radial tunnel.  Pain with wrist extension and third finger extension against resistance.  Imaging: None today.  Assessment & Plan: 1.  Persistent right elbow pain due to lateral epicondylitis -Resume occupational therapy.  Cortisone injection if she reaches a plateau.  She does not want one today.  Follow-up in about 4 weeks, out of work until January 15.     Procedures: No procedures performed  No notes on file     PMFS History: Patient Active Problem List   Diagnosis Date Noted  . Class 1 obesity due to excess calories without serious comorbidity with body mass index (BMI) of 32.0 to 32.9 in adult 03/14/2017  . Environmental and  seasonal allergies 03/14/2017  . Generalized anxiety disorder 03/14/2017  . Pure hypercholesterolemia 03/14/2017   Past Medical History:  Diagnosis Date  . Allergy   . Anxiety   . Depression   . History of kidney stones   . Hyperlipidemia     Family History  Problem Relation Age of Onset  . Cancer Father   . Diabetes Sister     Past Surgical History:  Procedure Laterality Date  . EXTRACORPOREAL SHOCK WAVE LITHOTRIPSY Left 04/16/2018   Procedure: EXTRACORPOREAL SHOCK WAVE LITHOTRIPSY (ESWL);  Surgeon: Ardis Hughs, MD;  Location: [facility];  Service: Urology;  Laterality: Left;  . LITHOTRIPSY     Social History   Occupational History  . Not on file  Tobacco Use  . Smoking status: Never Smoker  . Smokeless tobacco: Never Used  Substance and Sexual Activity  . Alcohol use: Yes    Alcohol/week: 1.0 standard drinks    Types: 1 Glasses of wine per week  . Drug use: No  . Sexual activity: Not on file

## 2019-05-25 MED FILL — FLUTICASONE PROP 50 MCG SPR: 50 | 60 days supply | Qty: 16 | Fill #1

## 2019-05-26 ENCOUNTER — Encounter: Payer: Self-pay | Admitting: Family Medicine

## 2019-05-26 ENCOUNTER — Other Ambulatory Visit: Payer: Self-pay

## 2019-05-26 ENCOUNTER — Encounter: Payer: Self-pay | Admitting: Gastroenterology

## 2019-05-26 ENCOUNTER — Ambulatory Visit (INDEPENDENT_AMBULATORY_CARE_PROVIDER_SITE_OTHER): Payer: 59 | Admitting: Family Medicine

## 2019-05-26 VITALS — BP 126/76 | HR 103 | Temp 96.2°F | Ht 63.0 in | Wt 193.4 lb

## 2019-05-26 DIAGNOSIS — E7800 Pure hypercholesterolemia, unspecified: Secondary | ICD-10-CM

## 2019-05-26 DIAGNOSIS — F411 Generalized anxiety disorder: Secondary | ICD-10-CM

## 2019-05-26 DIAGNOSIS — Z1231 Encounter for screening mammogram for malignant neoplasm of breast: Secondary | ICD-10-CM

## 2019-05-26 DIAGNOSIS — J3089 Other allergic rhinitis: Secondary | ICD-10-CM | POA: Diagnosis not present

## 2019-05-26 DIAGNOSIS — Z7689 Persons encountering health services in other specified circumstances: Secondary | ICD-10-CM | POA: Diagnosis not present

## 2019-05-26 DIAGNOSIS — Z800 Family history of malignant neoplasm of digestive organs: Secondary | ICD-10-CM

## 2019-05-26 DIAGNOSIS — R198 Other specified symptoms and signs involving the digestive system and abdomen: Secondary | ICD-10-CM

## 2019-05-26 DIAGNOSIS — Z0000 Encounter for general adult medical examination without abnormal findings: Secondary | ICD-10-CM | POA: Diagnosis not present

## 2019-05-26 MED ORDER — LEVOCETIRIZINE DIHYDROCHLORIDE 5 MG PO TABS: 5.0000 mg | ORAL_TABLET | Freq: Every evening | ORAL | 3 refills | Status: DC

## 2019-05-26 MED ORDER — FLUTICASONE PROPIONATE 50 MCG/ACT NA SUSP: 2.0000 | Freq: Every day | NASAL | 3 refills | Status: DC

## 2019-05-26 MED ORDER — MONTELUKAST SODIUM 10 MG PO TABS: 10.0000 mg | ORAL_TABLET | Freq: Every day | ORAL | 3 refills | Status: DC

## 2019-05-26 MED ORDER — SERTRALINE HCL 100 MG PO TABS: 100.0000 mg | ORAL_TABLET | Freq: Every day | ORAL | 3 refills | Status: DC

## 2019-05-26 MED ORDER — AZELASTINE HCL 0.1 % NA SOLN: 1.0000 | Freq: Every day | NASAL | 5 refills | Status: DC

## 2019-05-26 MED FILL — LEVOCETIRIZINE 5 MG TABLET: 5 | 90 days supply | Qty: 90 | Fill #0

## 2019-05-26 MED FILL — MONTELUKAST SOD 10 MG TAB: 10 | 90 days supply | Qty: 90 | Fill #0

## 2019-05-26 NOTE — Progress Notes (Addendum)
Lori Fisher is a 43 y.o. female  Chief Complaint  Patient presents with  . New Patient (Initial Visit)  . Annual Exam    no pap    HPI: Lori Fisher is a 43 y.o. female here as a new pt to establish care with our office and for annual CPE. Previous PCP Dr. [name].  Her flu vaccine is UTD, as is Tdap vaccine. Sepcialists:  She is currently following with ortho Dr. Eunice Blase for Rt lateral epicondylitis and is out of work until 06/25/19 as a result of this. She is scheduled for a cortisone injection.  Urology - Dr. Jeffie Pollock - kidney stones Allergy - for seasonal and environmental allergies She sees ophthalmologist - last appt in 05/2019 and seen annually Dentist - UTD   Last PAP: done in 2018 - normal Last mammo: due   Med refills needed today? Needs all meds refilled today  She requests new referral to GI, as referral placed by PCP 1 year ago likely expired. pts father with CRC around 45yo. Pt also with likely IBS with alternating diarrhea and constipation x years. No blood in stool. No unintentional weight loss. Minimal abdominal cramping/pain. No n/v. No fam h/o IBD.   Past Medical History:  Diagnosis Date  . Allergy   . Anxiety   . Depression   . History of kidney stones   . Hyperlipidemia   . Tennis elbow     Past Surgical History:  Procedure Laterality Date  . EXTRACORPOREAL SHOCK WAVE LITHOTRIPSY Left 04/16/2018   Procedure: EXTRACORPOREAL SHOCK WAVE LITHOTRIPSY (ESWL);  Surgeon: Ardis Hughs, MD;  Location: [facility];  Service: Urology;  Laterality: Left;  . KIDNEY STONE SURGERY    . LITHOTRIPSY      Social History   Socioeconomic History  . Marital status: Married    Spouse name: Not on file  . Number of children: Not on file  . Years of education: Not on file  . Highest education level: Not on file  Occupational History  . Not on file  Tobacco Use  . Smoking status: Never Smoker  . Smokeless tobacco: Never Used  Substance and Sexual Activity  .  Alcohol use: Yes    Alcohol/week: 1.0 standard drinks    Types: 1 Glasses of wine per week  . Drug use: No  . Sexual activity: Not on file  Other Topics Concern  . Not on file  Social History Narrative  . Not on file   Social Determinants of Health   Financial Resource Strain:   . Difficulty of Paying Living Expenses: Not on file  Food Insecurity:   . Worried About Charity fundraiser in the Last Year: Not on file  . Ran Out of Food in the Last Year: Not on file  Transportation Needs:   . Lack of Transportation (Medical): Not on file  . Lack of Transportation (Non-Medical): Not on file  Physical Activity:   . Days of Exercise per Week: Not on file  . Minutes of Exercise per Session: Not on file  Stress:   . Feeling of Stress : Not on file  Social Connections:   . Frequency of Communication with Friends and Family: Not on file  . Frequency of Social Gatherings with Friends and Family: Not on file  . Attends Religious Services: Not on file  . Active Member of Clubs or Organizations: Not on file  . Attends Archivist Meetings: Not on file  . Marital Status:  Not on file  Intimate Partner Violence:   . Fear of Current or Ex-Partner: Not on file  . Emotionally Abused: Not on file  . Physically Abused: Not on file  . Sexually Abused: Not on file    Family History  Problem Relation Age of Onset  . Cancer Father   . Diabetes Sister   . Hyperlipidemia Mother      Immunization History  Administered Date(s) Administered  . Influenza,inj,Quad PF,6+ Mos 03/10/2012  . Influenza-Unspecified 03/10/2017, 03/10/2019  . MMR 03/11/2014  . Tdap 05/22/2018    Outpatient Encounter Medications as of 05/26/2019  Medication Sig  . azelastine (ASTELIN) 0.1 % nasal spray Place 1 spray into both nostrils daily. Use in each nostril as directed  . Calcium Carb-Cholecalciferol (CALCIUM 1000 + D) 1000-800 MG-UNIT TABS   . ibuprofen (ADVIL) 800 MG tablet Take 1 tablet (800 mg total)  by mouth every 8 (eight) hours as needed.  Marland Kitchen levocetirizine (XYZAL) 5 MG tablet Take 1 tablet (5 mg total) by mouth every evening.  Marland Kitchen MELATONIN GUMMIES PO Take 5 mg by mouth.  . montelukast (SINGULAIR) 10 MG tablet Take 1 tablet (10 mg total) by mouth at bedtime.  . Multiple Vitamin (MULTIVITAMINS PO) Take 1 tablet by mouth daily.  . nitroGLYCERIN (NITRO-DUR) 0.1 mg/hr patch Apply 1/4 patch to affected area 12 hours daily  . sertraline (ZOLOFT) 100 MG tablet Take 1 tablet (100 mg total) by mouth daily.  . fluticasone (FLONASE) 50 MCG/ACT nasal spray Place 2 sprays into both nostrils daily for 10 days.  . Probiotic Product (PROBIOTIC-10 PO) Take by mouth.   No facility-administered encounter medications on file as of 05/26/2019.     ROS: Gen: no fever, chills  Skin: no rash, itching ENT: no ear pain, ear drainage, + nasal congestion, rhinorrhea, no sinus pressure or sore throat Eyes: no blurry vision, double vision Resp: no cough, wheeze,SOB Breast: no breast tenderness, no nipple discharge, no breast masses CV: no CP, palpitations, LE edema,  GI: as above in HPI GU: no dysuria, urgency, frequency, hematuria MSK: no joint pain, myalgias, back pain Neuro: no dizziness, headache, weakness Psych: no depression, + anxiety, no insomnia   No Known Allergies  BP 126/76   Pulse (!) 103   Temp (!) 96.2 F (35.7 C)   Ht '5\' 3"'  (1.6 m)   Wt 193 lb 6.4 oz (87.7 kg)   LMP 04/25/2019 (Approximate)   SpO2 95%   BMI 34.26 kg/m   Pulse Readings from Last 3 Encounters:  05/26/19 (!) 103  05/22/18 97  04/17/18 92    Physical Exam  Constitutional: She is oriented to person, place, and time. She appears well-developed and well-nourished. No distress.  HENT:  Head: Normocephalic and atraumatic.  Right Ear: Tympanic membrane and ear canal normal.  Left Ear: Tympanic membrane and ear canal normal.  Nose: Nose normal.  Mouth/Throat: Oropharynx is clear and moist and mucous membranes are  normal.  Eyes: Pupils are equal, round, and reactive to light. Conjunctivae are normal.  Neck: Thyromegaly present.  Cardiovascular: Normal rate, regular rhythm, normal heart sounds and intact distal pulses.  No murmur heard. Pulmonary/Chest: Effort normal and breath sounds normal. No respiratory distress. She has no wheezes. She has no rhonchi.  Abdominal: Soft. Bowel sounds are normal. She exhibits no distension and no mass. There is no abdominal tenderness.  Musculoskeletal:        General: No edema.     Cervical back: Neck supple.  Lymphadenopathy:  She has no cervical adenopathy.  Neurological: She is alert and oriented to person, place, and time. She exhibits normal muscle tone. Coordination normal.  Skin: Skin is warm and dry.  Psychiatric: She has a normal mood and affect. Her behavior is normal.     A/P:  1. Annual physical exam - due for mammo, due next year for PAP - immunizations UTD - discussed importance of regular CV exercise, healthy diet, adequate sleep - dental and vision exams UTD - ALT; Future - AST; Future - Basic metabolic panel; Future - CBC; Future - Lipid panel; Future - VITAMIN D 25 Hydroxy (Vit-D Deficiency, Fractures); Future - next CPE in 1 year  2. Encounter to establish care with new doctor  3. Pure hypercholesterolemia - stressed importance of adherence to low fat diet and regular CV exercise - Lipid panel; Future  4. Environmental and seasonal allergies - cont regular f/u with allergist Refill: - levocetirizine (XYZAL) 5 MG tablet; Take 1 tablet (5 mg total) by mouth every evening.  Dispense: 90 tablet; Refill: 3 - fluticasone (FLONASE) 50 MCG/ACT nasal spray; Place 2 sprays into both nostrils daily.  Dispense: 16 g; Refill: 3 - montelukast (SINGULAIR) 10 MG tablet; Take 1 tablet (10 mg total) by mouth at bedtime.  Dispense: 90 tablet; Refill: 3 - azelastine (ASTELIN) 0.1 % nasal spray; Place 1 spray into both nostrils daily. Use in each  nostril as directed  Dispense: 30 mL; Refill: 5  5. Generalized anxiety disorder - stable, well-controlled Refill: - sertraline (ZOLOFT) 100 MG tablet; Take 1 tablet (100 mg total) by mouth daily.  Dispense: 90 tablet; Refill: 3  6. Alternating constipation and diarrhea - likely IBS, symptoms x 10-15 years, no red flag symptoms - previous PCP placed GI referral last year but pt cancelled appt - Ambulatory referral to Gastroenterology  7. Family history of colon cancer in father - diagnosed around 60yo - Ambulatory referral to Gastroenterology  8. Encounter for screening mammogram for breast cancer - MM DIGITAL SCREENING BILATERAL; Future  This visit occurred during the SARS-CoV-2 public health emergency.  Safety protocols were in place, including screening questions prior to the visit, additional usage of staff PPE, and extensive cleaning of exam room while observing appropriate contact time as indicated for disinfecting solutions.

## 2019-05-26 NOTE — Patient Instructions (Signed)
Health Maintenance, Female Adopting a healthy lifestyle and getting preventive care are important in promoting health and wellness. Ask your health care provider about:  The right schedule for you to have regular tests and exams.  Things you can do on your own to prevent diseases and keep yourself healthy. What should I know about diet, weight, and exercise? Eat a healthy diet   Eat a diet that includes plenty of vegetables, fruits, low-fat dairy products, and lean protein.  Do not eat a lot of foods that are high in solid fats, added sugars, or sodium. Maintain a healthy weight Body mass index (BMI) is used to identify weight problems. It estimates body fat based on height and weight. Your health care provider can help determine your BMI and help you achieve or maintain a healthy weight. Get regular exercise Get regular exercise. This is one of the most important things you can do for your health. Most adults should:  Exercise for at least 150 minutes each week. The exercise should increase your heart rate and make you sweat (moderate-intensity exercise).  Do strengthening exercises at least twice a week. This is in addition to the moderate-intensity exercise.  Spend less time sitting. Even light physical activity can be beneficial. Watch cholesterol and blood lipids Have your blood tested for lipids and cholesterol at 44 years of age, then have this test every 5 years. Have your cholesterol levels checked more often if:  Your lipid or cholesterol levels are high.  You are older than 44 years of age.  You are at high risk for heart disease. What should I know about cancer screening? Depending on your health history and family history, you may need to have cancer screening at various ages. This may include screening for:  Breast cancer.  Cervical cancer.  Colorectal cancer.  Skin cancer.  Lung cancer. What should I know about heart disease, diabetes, and high blood  pressure? Blood pressure and heart disease  High blood pressure causes heart disease and increases the risk of stroke. This is more likely to develop in people who have high blood pressure readings, are of African descent, or are overweight.  Have your blood pressure checked: ? Every 3-5 years if you are 18-39 years of age. ? Every year if you are 40 years old or older. Diabetes Have regular diabetes screenings. This checks your fasting blood sugar level. Have the screening done:  Once every three years after age 40 if you are at a normal weight and have a low risk for diabetes.  More often and at a younger age if you are overweight or have a high risk for diabetes. What should I know about preventing infection? Hepatitis B If you have a higher risk for hepatitis B, you should be screened for this virus. Talk with your health care provider to find out if you are at risk for hepatitis B infection. Hepatitis C Testing is recommended for:  Everyone born from 1945 through 1965.  Anyone with known risk factors for hepatitis C. Sexually transmitted infections (STIs)  Get screened for STIs, including gonorrhea and chlamydia, if: ? You are sexually active and are younger than 44 years of age. ? You are older than 44 years of age and your health care provider tells you that you are at risk for this type of infection. ? Your sexual activity has changed since you were last screened, and you are at increased risk for chlamydia or gonorrhea. Ask your health care provider if   you are at risk.  Ask your health care provider about whether you are at high risk for HIV. Your health care provider may recommend a prescription medicine to help prevent HIV infection. If you choose to take medicine to prevent HIV, you should first get tested for HIV. You should then be tested every 3 months for as long as you are taking the medicine. Pregnancy  If you are about to stop having your period (premenopausal) and  you may become pregnant, seek counseling before you get pregnant.  Take 400 to 800 micrograms (mcg) of folic acid every day if you become pregnant.  Ask for birth control (contraception) if you want to prevent pregnancy. Osteoporosis and menopause Osteoporosis is a disease in which the bones lose minerals and strength with aging. This can result in bone fractures. If you are 65 years old or older, or if you are at risk for osteoporosis and fractures, ask your health care provider if you should:  Be screened for bone loss.  Take a calcium or vitamin D supplement to lower your risk of fractures.  Be given hormone replacement therapy (HRT) to treat symptoms of menopause. Follow these instructions at home: Lifestyle  Do not use any products that contain nicotine or tobacco, such as cigarettes, e-cigarettes, and chewing tobacco. If you need help quitting, ask your health care provider.  Do not use street drugs.  Do not share needles.  Ask your health care provider for help if you need support or information about quitting drugs. Alcohol use  Do not drink alcohol if: ? Your health care provider tells you not to drink. ? You are pregnant, may be pregnant, or are planning to become pregnant.  If you drink alcohol: ? Limit how much you use to 0-1 drink a day. ? Limit intake if you are breastfeeding.  Be aware of how much alcohol is in your drink. In the [place] one drink equals one 12 oz bottle of beer (355 mL), one 5 oz glass of wine (148 mL), or one 1 oz glass of hard liquor (44 mL). General instructions  Schedule regular health, dental, and eye exams.  Stay current with your vaccines.  Tell your health care provider if: ? You often feel depressed. ? You have ever been abused or do not feel safe at home. Summary  Adopting a healthy lifestyle and getting preventive care are important in promoting health and wellness.  Follow your health care provider's instructions about healthy  diet, exercising, and getting tested or screened for diseases.  Follow your health care provider's instructions on monitoring your cholesterol and blood pressure. This information is not intended to replace advice given to you by your health care provider. Make sure you discuss any questions you have with your health care provider. Document Released: 12/10/2010 Document Revised: 05/20/2018 Document Reviewed: 05/20/2018 Elsevier Patient Education  2020 [employer] Inc[employer]  

## 2019-05-27 ENCOUNTER — Ambulatory Visit: Payer: Self-pay

## 2019-05-27 ENCOUNTER — Encounter: Payer: Self-pay | Admitting: Family Medicine

## 2019-05-27 ENCOUNTER — Ambulatory Visit (INDEPENDENT_AMBULATORY_CARE_PROVIDER_SITE_OTHER): Payer: 59 | Admitting: Family Medicine

## 2019-05-27 DIAGNOSIS — M25521 Pain in right elbow: Secondary | ICD-10-CM

## 2019-05-27 MED ORDER — TRAMADOL HCL 50 MG PO TABS: 50.0000 mg | ORAL_TABLET | Freq: Four times a day (QID) | ORAL | 0 refills | Status: DC | PRN

## 2019-05-27 MED FILL — traMADol HCL 50 MG TABS: 50 | 5 days supply | Qty: 20 | Fill #0

## 2019-05-27 NOTE — Progress Notes (Signed)
Office Visit Note   Patient: Lori Fisher           Date of Birth: 10-05-1975           MRN: [id number] Visit Date: 05/27/2019 Requested by: Shawnee Knapp, MD [place],  [place] [id number] PCP: Ronnald Nian, DO  Subjective: Chief Complaint  Patient presents with  . Right Elbow - Pain    Would like to proceed with cortisone injection.    HPI: She is here with persistent right elbow pain.  She is still waiting for authorization to resume physical therapy but even after a lot of treatments, she is still having lateral elbow pain.  The pain seems to radiate toward the dorsum of her wrist.  She has not had any numbness or tingling.  She wonders whether an injection might help.              ROS: No fevers or chills.  All other systems were reviewed and are negative.  Objective: Vital Signs: There were no vitals taken for this visit.  Physical Exam:  General:  Alert and oriented, in no acute distress. Pulm:  Breathing unlabored. Psy:  Normal mood, congruent affect. Skin: No rash or bruising. Right elbow: She is still tender at the common extensor tendon but also has tenderness at the radial tunnel.  She has pain with wrist extension and third finger extension against resistance as well as forearm pronation.  Imaging: Limited diagnostic ultrasound: The posterior interosseous nerve was followed through the radial tunnel.  Palpation over it seemed to reproduce her pain.  Assessment & Plan: 1.  Persistent right elbow pain with lateral epicondylitis and possible radial tunnel syndrome -Discussed with patient and elected to perform ultrasound-guided radial tunnel injection today.  She will rest the elbow over the weekend and call me next week to let me know how she is doing.     Procedures: Right elbow radial tunnel injection: After sterile prep with Betadine, injected 2 cc 1% lidocaine without epinephrine and 40 mg methylprednisolone passing the needle near the radial  nerve within the radial tunnel.  She had modest improvement.    PMFS History: Patient Active Problem List   Diagnosis Date Noted  . Class 1 obesity due to excess calories without serious comorbidity with body mass index (BMI) of 32.0 to 32.9 in adult 03/14/2017  . Environmental and seasonal allergies 03/14/2017  . Generalized anxiety disorder 03/14/2017  . Pure hypercholesterolemia 03/14/2017   Past Medical History:  Diagnosis Date  . Allergy   . Anxiety   . Depression   . History of kidney stones   . Hyperlipidemia   . Tennis elbow     Family History  Problem Relation Age of Onset  . Cancer Father   . Diabetes Sister   . Hyperlipidemia Mother     Past Surgical History:  Procedure Laterality Date  . EXTRACORPOREAL SHOCK WAVE LITHOTRIPSY Left 04/16/2018   Procedure: EXTRACORPOREAL SHOCK WAVE LITHOTRIPSY (ESWL);  Surgeon: Ardis Hughs, MD;  Location: [facility];  Service: Urology;  Laterality: Left;  . KIDNEY STONE SURGERY    . LITHOTRIPSY     Social History   Occupational History  . Not on file  Tobacco Use  . Smoking status: Never Smoker  . Smokeless tobacco: Never Used  Substance and Sexual Activity  . Alcohol use: Yes    Alcohol/week: 1.0 standard drinks    Types: 1 Glasses of wine per week  . Drug  use: No  . Sexual activity: Not on file

## 2019-05-28 ENCOUNTER — Other Ambulatory Visit (INDEPENDENT_AMBULATORY_CARE_PROVIDER_SITE_OTHER): Payer: 59

## 2019-05-28 ENCOUNTER — Other Ambulatory Visit: Payer: Self-pay

## 2019-05-28 DIAGNOSIS — E7800 Pure hypercholesterolemia, unspecified: Secondary | ICD-10-CM

## 2019-05-28 DIAGNOSIS — Z0000 Encounter for general adult medical examination without abnormal findings: Secondary | ICD-10-CM | POA: Diagnosis not present

## 2019-05-28 LAB — BASIC METABOLIC PANEL
BUN: 11 mg/dL (ref 6–23)
CO2: 30 mEq/L (ref 19–32)
Calcium: 9.7 mg/dL (ref 8.4–10.5)
Chloride: 101 mEq/L (ref 96–112)
Creatinine, Ser: 0.57 mg/dL (ref 0.40–1.20)
GFR: 115.07 mL/min (ref >60.00)
Glucose, Bld: 88 mg/dL (ref 70–99)
Potassium: 4.3 mEq/L (ref 3.5–5.1)
Sodium: 141 mEq/L (ref 135–145)

## 2019-05-28 LAB — CBC
HCT: 40.5 % (ref 36.0–46.0)
Hemoglobin: 13.8 g/dL (ref 12.0–15.0)
MCHC: 34 g/dL (ref 30.0–36.0)
MCV: 92.2 fl (ref 78.0–100.0)
Platelets: 270 10*3/uL (ref 150.0–400.0)
RBC: 4.39 Mil/uL (ref 3.87–5.11)
RDW: 13.5 % (ref 11.5–15.5)
WBC: 10.1 10*3/uL (ref 4.0–10.5)

## 2019-05-28 LAB — LIPID PANEL
Cholesterol: 285 mg/dL — ABNORMAL HIGH (ref 0–200)
HDL: 44.9 mg/dL (ref >39.00)
NonHDL: 239.84
Total CHOL/HDL Ratio: 6
Triglycerides: 300 mg/dL — ABNORMAL HIGH (ref 0.0–149.0)
VLDL: 60 mg/dL — ABNORMAL HIGH (ref 0.0–40.0)

## 2019-05-28 LAB — VITAMIN D 25 HYDROXY (VIT D DEFICIENCY, FRACTURES): VITD: 21.99 ng/mL — ABNORMAL LOW (ref 30.00–100.00)

## 2019-05-28 LAB — ALT: ALT: 31 U/L (ref 0–35)

## 2019-05-28 LAB — LDL CHOLESTEROL, DIRECT: Direct LDL: 196 mg/dL

## 2019-05-28 LAB — AST: AST: 24 U/L (ref 0–37)

## 2019-05-31 ENCOUNTER — Other Ambulatory Visit: Payer: Self-pay | Admitting: Family Medicine

## 2019-05-31 MED FILL — traMADol HCL 50 MG TABS: 50 | 5 days supply | Qty: 20 | Fill #0

## 2019-06-09 ENCOUNTER — Encounter: Payer: Self-pay | Admitting: Family Medicine

## 2019-06-09 ENCOUNTER — Other Ambulatory Visit: Payer: Self-pay | Admitting: Family Medicine

## 2019-06-09 DIAGNOSIS — E559 Vitamin D deficiency, unspecified: Secondary | ICD-10-CM

## 2019-06-09 MED ORDER — VITAMIN D (ERGOCALCIFEROL) 1.25 MG (50000 UNIT) PO CAPS: 50000.0000 [IU] | ORAL_CAPSULE | ORAL | 2 refills | Status: DC

## 2019-06-09 MED FILL — VIT D2 1.25 MG (50,000 UNIT: 1.25 MG | 27 days supply | Qty: 4 | Fill #0

## 2019-06-15 DIAGNOSIS — M70931 Unspecified soft tissue disorder related to use, overuse and pressure, right forearm: Secondary | ICD-10-CM | POA: Diagnosis not present

## 2019-06-15 DIAGNOSIS — M25631 Stiffness of right wrist, not elsewhere classified: Secondary | ICD-10-CM | POA: Diagnosis not present

## 2019-06-15 DIAGNOSIS — R202 Paresthesia of skin: Secondary | ICD-10-CM | POA: Diagnosis not present

## 2019-06-15 DIAGNOSIS — M25521 Pain in right elbow: Secondary | ICD-10-CM | POA: Diagnosis not present

## 2019-06-15 DIAGNOSIS — M7711 Lateral epicondylitis, right elbow: Secondary | ICD-10-CM | POA: Diagnosis not present

## 2019-06-15 DIAGNOSIS — M25621 Stiffness of right elbow, not elsewhere classified: Secondary | ICD-10-CM | POA: Diagnosis not present

## 2019-06-15 DIAGNOSIS — M6281 Muscle weakness (generalized): Secondary | ICD-10-CM | POA: Diagnosis not present

## 2019-06-16 ENCOUNTER — Other Ambulatory Visit: Payer: Self-pay

## 2019-06-16 ENCOUNTER — Encounter: Payer: Self-pay | Admitting: Family Medicine

## 2019-06-16 ENCOUNTER — Ambulatory Visit (INDEPENDENT_AMBULATORY_CARE_PROVIDER_SITE_OTHER): Payer: 59 | Admitting: Family Medicine

## 2019-06-16 VITALS — Ht 63.0 in | Wt 183.0 lb

## 2019-06-16 DIAGNOSIS — M25521 Pain in right elbow: Secondary | ICD-10-CM

## 2019-06-16 NOTE — Progress Notes (Signed)
Office Visit Note   Patient: Lori Fisher           Date of Birth: 1976-02-11           MRN: [id number] Visit Date: 06/16/2019 Requested by: Shawnee Knapp, MD [place],  [place] [address] PCP: Ronnald Nian, DO  Subjective: Chief Complaint  Patient presents with  . Right Elbow - Pain    Started back with OT yesterday (after 6 weeks away). The injection at last ov did not help. Wears TE band - helps a little when she has to lift objects, but does not help "as much as I would like."    HPI: She is here for follow-up chronic right elbow pain with lateral epicondylitis and presumed radial tunnel syndrome.  Radial tunnel injection gave some improvement during the anesthetic phase but no improvement after that.  She just started therapy again yesterday and the plan is to continue it for another month.  She remains out of work.              ROS:   All other systems were reviewed and are negative.  Objective: Vital Signs: Ht 5\' 3"  (1.6 m)   Wt 183 lb (83 kg)   BMI 32.42 kg/m   Physical Exam:  General:  Alert and oriented, in no acute distress. Pulm:  Breathing unlabored. Psy:  Normal mood, congruent affect.  Right elbow: Full range of motion.  She has slight weakness with wrist extension and biceps flexion.  She is point tender over the common extensor tendon at the lateral epicondyle and also is tender over the radial tunnel.  She has pain with wrist extension and third finger extension against resistance.  Imaging: None today  Assessment & Plan: 1.  Persistent right elbow pain with lateral epicondylitis and probable radial tunnel syndrome -Therapy for another 4 weeks.  If she fails to improve then we will go ahead and refer her to Dr. Amedeo Plenty for surgical consultation. -If she does well I will see her back in 4 weeks and probably release her to work.     Procedures: No procedures performed  No notes on file     PMFS History: Patient Active Problem List   Diagnosis Date Noted  . Vitamin D deficiency 06/09/2019  . Class 1 obesity due to excess calories without serious comorbidity with body mass index (BMI) of 32.0 to 32.9 in adult 03/14/2017  . Environmental and seasonal allergies 03/14/2017  . Generalized anxiety disorder 03/14/2017  . Pure hypercholesterolemia 03/14/2017   Past Medical History:  Diagnosis Date  . Allergy   . Anxiety   . Depression   . History of kidney stones   . Hyperlipidemia   . Tennis elbow     Family History  Problem Relation Age of Onset  . Cancer Father   . Diabetes Sister   . Hyperlipidemia Mother     Past Surgical History:  Procedure Laterality Date  . EXTRACORPOREAL SHOCK WAVE LITHOTRIPSY Left 04/16/2018   Procedure: EXTRACORPOREAL SHOCK WAVE LITHOTRIPSY (ESWL);  Surgeon: Ardis Hughs, MD;  Location: [facility];  Service: Urology;  Laterality: Left;  . KIDNEY STONE SURGERY    . LITHOTRIPSY     Social History   Occupational History  . Not on file  Tobacco Use  . Smoking status: Never Smoker  . Smokeless tobacco: Never Used  Substance and Sexual Activity  . Alcohol use: Yes    Alcohol/week: 1.0 standard drinks  Types: 1 Glasses of wine per week  . Drug use: No  . Sexual activity: Not on file

## 2019-06-17 DIAGNOSIS — M25621 Stiffness of right elbow, not elsewhere classified: Secondary | ICD-10-CM | POA: Diagnosis not present

## 2019-06-17 DIAGNOSIS — M25631 Stiffness of right wrist, not elsewhere classified: Secondary | ICD-10-CM | POA: Diagnosis not present

## 2019-06-17 DIAGNOSIS — M70931 Unspecified soft tissue disorder related to use, overuse and pressure, right forearm: Secondary | ICD-10-CM | POA: Diagnosis not present

## 2019-06-17 DIAGNOSIS — M7711 Lateral epicondylitis, right elbow: Secondary | ICD-10-CM | POA: Diagnosis not present

## 2019-06-17 DIAGNOSIS — M6281 Muscle weakness (generalized): Secondary | ICD-10-CM | POA: Diagnosis not present

## 2019-06-17 DIAGNOSIS — R202 Paresthesia of skin: Secondary | ICD-10-CM | POA: Diagnosis not present

## 2019-06-17 DIAGNOSIS — M25521 Pain in right elbow: Secondary | ICD-10-CM | POA: Diagnosis not present

## 2019-06-22 ENCOUNTER — Encounter: Payer: Self-pay | Admitting: Family Medicine

## 2019-06-22 DIAGNOSIS — M25521 Pain in right elbow: Secondary | ICD-10-CM | POA: Diagnosis not present

## 2019-06-22 DIAGNOSIS — M70931 Unspecified soft tissue disorder related to use, overuse and pressure, right forearm: Secondary | ICD-10-CM | POA: Diagnosis not present

## 2019-06-22 DIAGNOSIS — M6281 Muscle weakness (generalized): Secondary | ICD-10-CM | POA: Diagnosis not present

## 2019-06-22 DIAGNOSIS — M25621 Stiffness of right elbow, not elsewhere classified: Secondary | ICD-10-CM | POA: Diagnosis not present

## 2019-06-22 DIAGNOSIS — R202 Paresthesia of skin: Secondary | ICD-10-CM | POA: Diagnosis not present

## 2019-06-22 DIAGNOSIS — M25631 Stiffness of right wrist, not elsewhere classified: Secondary | ICD-10-CM | POA: Diagnosis not present

## 2019-06-22 DIAGNOSIS — M7711 Lateral epicondylitis, right elbow: Secondary | ICD-10-CM | POA: Diagnosis not present

## 2019-06-24 DIAGNOSIS — M70931 Unspecified soft tissue disorder related to use, overuse and pressure, right forearm: Secondary | ICD-10-CM | POA: Diagnosis not present

## 2019-06-24 DIAGNOSIS — M25631 Stiffness of right wrist, not elsewhere classified: Secondary | ICD-10-CM | POA: Diagnosis not present

## 2019-06-24 DIAGNOSIS — R202 Paresthesia of skin: Secondary | ICD-10-CM | POA: Diagnosis not present

## 2019-06-24 DIAGNOSIS — M6281 Muscle weakness (generalized): Secondary | ICD-10-CM | POA: Diagnosis not present

## 2019-06-24 DIAGNOSIS — M25621 Stiffness of right elbow, not elsewhere classified: Secondary | ICD-10-CM | POA: Diagnosis not present

## 2019-06-24 DIAGNOSIS — M25521 Pain in right elbow: Secondary | ICD-10-CM | POA: Diagnosis not present

## 2019-06-24 DIAGNOSIS — M7711 Lateral epicondylitis, right elbow: Secondary | ICD-10-CM | POA: Diagnosis not present

## 2019-06-28 ENCOUNTER — Ambulatory Visit (INDEPENDENT_AMBULATORY_CARE_PROVIDER_SITE_OTHER): Payer: 59 | Admitting: Gastroenterology

## 2019-06-28 ENCOUNTER — Other Ambulatory Visit: Payer: Self-pay

## 2019-06-28 ENCOUNTER — Encounter: Payer: Self-pay | Admitting: Gastroenterology

## 2019-06-28 VITALS — BP 120/78 | HR 94 | Temp 98.2°F | Ht 63.0 in | Wt 187.2 lb

## 2019-06-28 DIAGNOSIS — R140 Abdominal distension (gaseous): Secondary | ICD-10-CM

## 2019-06-28 DIAGNOSIS — R197 Diarrhea, unspecified: Secondary | ICD-10-CM | POA: Diagnosis not present

## 2019-06-28 DIAGNOSIS — Z800 Family history of malignant neoplasm of digestive organs: Secondary | ICD-10-CM

## 2019-06-28 DIAGNOSIS — K582 Mixed irritable bowel syndrome: Secondary | ICD-10-CM

## 2019-06-28 DIAGNOSIS — E559 Vitamin D deficiency, unspecified: Secondary | ICD-10-CM

## 2019-06-28 DIAGNOSIS — Z01818 Encounter for other preprocedural examination: Secondary | ICD-10-CM | POA: Diagnosis not present

## 2019-06-28 MED ORDER — CLENPIQ 10-3.5-12 MG-GM -GM/160ML PO SOLN: 1.0000 | Freq: Once | ORAL | 0 refills | Status: AC

## 2019-06-28 MED FILL — CLENPIQ 10-3.5-12 MG-GM -GM: 10-3.5-12 M | 2 days supply | Qty: 320 | Fill #0

## 2019-06-28 NOTE — Progress Notes (Signed)
Chief Complaint: Bloating, change in bowel habits, increased gas, family history of colon cancer  Referring Provider:     Ronnald Nian, DO   HPI:    Lori Fisher is a 44 y.o. female with a history of vitamin D deficiency, anxiety, depression, hyperlipidemia, nephrolithiasis, referred to [facility] for evaluation of multiple GI symptoms, to include abdominal bloating, change in bowel habits, increased gas/flatus.  Longstanding history of alternating bowel habits between diarrhea/constipation for many years.  No hematochezia.  No associated weight loss, night sweats, fever, chills.  Does have mild abdominal cramping. Increased gas/flatus.   Has trialed Zantac years ago- hard to recall if any improvement in sxs. Previously trialed course of probiotics- not sure if this was effective. Trialed fiber gummies and more recently increased water intake w/ some improvement. Sxs seem worse worse with dairy- uses Lactaid x years. Also sxs worse with fried foods, hard-boiled eggs. Has had hemorrhoids with episodes of constipation, characterized by itching, irritation and rare BRBPR.   Rare nausea/vomiting when sxs significant.   Also trying intermittent fasting.   No reflux sxs, dysphagia.   Labs in 05/2019: Normal CBC, vitamin D 21.99, normal BMP, AST, ALT.  No recent abdominal imaging for review today.  No previous  Colonoscopy. EGD many years ago in [place].   Family history notable for father with CRC around age 46.  Sister with diverticulosis. No family history of IBD.   Past Medical History:  Diagnosis Date  . Allergy   . Anxiety   . Depression   . History of kidney stones   . Hyperlipidemia   . Tennis elbow      Past Surgical History:  Procedure Laterality Date  . EXTRACORPOREAL SHOCK WAVE LITHOTRIPSY Left 04/16/2018   Procedure: EXTRACORPOREAL SHOCK WAVE LITHOTRIPSY (ESWL);  Surgeon: Ardis Hughs, MD;  Location: [facility];  Service: Urology;   Laterality: Left;  . KIDNEY STONE SURGERY    . LITHOTRIPSY     Family History  Problem Relation Age of Onset  . Colon cancer Father   . Colon polyps Father   . Diabetes Sister   . Hyperlipidemia Mother   . Esophageal cancer Neg Hx    Social History   Tobacco Use  . Smoking status: Never Smoker  . Smokeless tobacco: Never Used  Substance Use Topics  . Alcohol use: Yes    Alcohol/week: 1.0 standard drinks    Types: 1 Glasses of wine per week  . Drug use: No   Current Outpatient Medications  Medication Sig Dispense Refill  . azelastine (ASTELIN) 0.1 % nasal spray Place 1 spray into both nostrils daily. Use in each nostril as directed 30 mL 5  . fluticasone (FLONASE) 50 MCG/ACT nasal spray Place 2 sprays into both nostrils daily. 16 g 3  . ibuprofen (ADVIL) 800 MG tablet Take 1 tablet (800 mg total) by mouth every 8 (eight) hours as needed. 90 tablet 1  . levocetirizine (XYZAL) 5 MG tablet Take 1 tablet (5 mg total) by mouth every evening. 90 tablet 3  . MELATONIN GUMMIES PO Take 5 mg by mouth.    . montelukast (SINGULAIR) 10 MG tablet Take 1 tablet (10 mg total) by mouth at bedtime. 90 tablet 3  . Multiple Vitamin (MULTIVITAMINS PO) Take 1 tablet by mouth daily.    . sertraline (ZOLOFT) 100 MG tablet Take 1 tablet (100 mg total) by mouth daily. 90 tablet 3  .  traMADol (ULTRAM) 50 MG tablet TAKE 1 TABLET BY MOUTH EVERY 6 HOURS AS NEEDED. 20 tablet 0  . Vitamin D, Ergocalciferol, (DRISDOL) 1.25 MG (50000 UT) CAPS capsule Take 1 capsule (50,000 Units total) by mouth every 7 (seven) days. 5 capsule 2  . Probiotic Product (PROBIOTIC-10 PO) Take by mouth.     No current facility-administered medications for this visit.   Allergies  Allergen Reactions  . Lactose Intolerance (Gi)      Review of Systems: All systems reviewed and negative except where noted in HPI.     Physical Exam:    Wt Readings from Last 3 Encounters:  06/28/19 187 lb 4 oz (84.9 kg)  06/16/19 183 lb (83  kg)  05/26/19 193 lb 6.4 oz (87.7 kg)    BP 120/78   Pulse 94   Temp 98.2 F (36.8 C)   Ht 5\' 3"  (1.6 m)   Wt 187 lb 4 oz (84.9 kg)   BMI 33.17 kg/m  Constitutional:  Pleasant, in no acute distress. Psychiatric: Normal mood and affect. Behavior is normal. EENT: Pupils normal.  Conjunctivae are normal. No scleral icterus. Neck supple. No cervical LAD. Cardiovascular: Normal rate, regular rhythm. No edema Pulmonary/chest: Effort normal and breath sounds normal. No wheezing, rales or rhonchi. Abdominal: Soft, nondistended, nontender. Bowel sounds active throughout. There are no masses palpable. No hepatomegaly. Neurological: Alert and oriented to person place and time. Skin: Skin is warm and dry. No rashes noted. Rectal: Exam deferred to time of colonoscopy.    ASSESSMENT AND PLAN;   1) IBS mixed type 2) Diarrhea 3) Constipation 4) Bloating Clinical presentation certainly consistent with IBS mixed type.  Given ongoing, increasingly bothersome symptoms, will plan on evaluation as below: -EGD with duodenal biopsies -Colonoscopy with random and directed biopsies -Increase dietary fiber.  Provided with fiber chart today with detailed explanation -Start low FODMAP diet.  Provided with handout today with detailed explanation -Continue diet diary to identify potential triggers  5) Hematochezia -Evaluate for hemorrhoids, to include grade in severity, at time of colonoscopy -Evaluate for improvement with interventions as above -Pending appearance and sponsor therapy, could consider hemorrhoid band ligation  6) Family history of colon cancer: -Father diagnosed with colon cancer at age 47.  Appropriate to start CRC screening early -Colonoscopy as above  7) Vitamin D deficiency: -Evaluate for small bowel pathology at time of EGD with duodenal biopsies as above -Already started on ergocalciferol  The indications, risks, and benefits of EGD and colonoscopy were explained to the  patient in detail. Risks include but are not limited to bleeding, perforation, adverse reaction to medications, and cardiopulmonary compromise. Sequelae include but are not limited to the possibility of surgery, hositalization, and mortality. The patient verbalized understanding and wished to proceed. All questions answered, referred to scheduler and bowel prep ordered. Further recommendations pending results of the exam.    Lavena Bullion, DO, FACG  06/28/2019, 3:36 PM   Sonoma Firkus, Garvin Fila, DO

## 2019-06-28 NOTE — Patient Instructions (Signed)
Fiber Chart  You should be consuming 25-30g of fiber per day and drinking 8 glasses of water to help your bowels move regularly.  In the chart below you can look up how much fiber you are getting in an average day.  If you are not getting enough fiber, you should add a fiber supplement to your diet.  Examples of this include Metamucil, FiberCon and Citrucel.  These can be purchased at your local grocery store or pharmacy.      [email or web address]-guerrero.[email or web address] You have been given information of a Low FODMAP diet.  You have been scheduled for an endoscopy and colonoscopy. Please follow the written instructions given to you at your visit today. Please pick up your prep supplies at the pharmacy within the next 1-3 days. If you use inhalers (even only as needed), please bring them with you on the day of your procedure. Your physician has requested that you go to www.startemmi.com and enter the access code given to you at your visit today. This web site gives a general overview about your procedure. However, you should still follow specific instructions given to you by our office regarding your preparation for the procedure.  It was a pleasure to see you today!  Vito Cirigliano, D.O.

## 2019-06-29 DIAGNOSIS — M25621 Stiffness of right elbow, not elsewhere classified: Secondary | ICD-10-CM | POA: Diagnosis not present

## 2019-06-29 DIAGNOSIS — M25521 Pain in right elbow: Secondary | ICD-10-CM | POA: Diagnosis not present

## 2019-06-29 DIAGNOSIS — M25631 Stiffness of right wrist, not elsewhere classified: Secondary | ICD-10-CM | POA: Diagnosis not present

## 2019-06-29 DIAGNOSIS — M70931 Unspecified soft tissue disorder related to use, overuse and pressure, right forearm: Secondary | ICD-10-CM | POA: Diagnosis not present

## 2019-06-29 DIAGNOSIS — M7711 Lateral epicondylitis, right elbow: Secondary | ICD-10-CM | POA: Diagnosis not present

## 2019-06-29 DIAGNOSIS — M6281 Muscle weakness (generalized): Secondary | ICD-10-CM | POA: Diagnosis not present

## 2019-06-29 DIAGNOSIS — R202 Paresthesia of skin: Secondary | ICD-10-CM | POA: Diagnosis not present

## 2019-06-30 ENCOUNTER — Encounter: Payer: Self-pay | Admitting: Gastroenterology

## 2019-06-30 MED FILL — VIT D2 1.25 MG (50,000 UNIT: 1.25 MG | 28 days supply | Qty: 4 | Fill #1

## 2019-07-01 DIAGNOSIS — M70931 Unspecified soft tissue disorder related to use, overuse and pressure, right forearm: Secondary | ICD-10-CM | POA: Diagnosis not present

## 2019-07-01 DIAGNOSIS — M25621 Stiffness of right elbow, not elsewhere classified: Secondary | ICD-10-CM | POA: Diagnosis not present

## 2019-07-01 DIAGNOSIS — M25631 Stiffness of right wrist, not elsewhere classified: Secondary | ICD-10-CM | POA: Diagnosis not present

## 2019-07-01 DIAGNOSIS — M7711 Lateral epicondylitis, right elbow: Secondary | ICD-10-CM | POA: Diagnosis not present

## 2019-07-01 DIAGNOSIS — M25521 Pain in right elbow: Secondary | ICD-10-CM | POA: Diagnosis not present

## 2019-07-01 DIAGNOSIS — M6281 Muscle weakness (generalized): Secondary | ICD-10-CM | POA: Diagnosis not present

## 2019-07-01 DIAGNOSIS — R202 Paresthesia of skin: Secondary | ICD-10-CM | POA: Diagnosis not present

## 2019-07-05 DIAGNOSIS — M6281 Muscle weakness (generalized): Secondary | ICD-10-CM | POA: Diagnosis not present

## 2019-07-05 DIAGNOSIS — M25521 Pain in right elbow: Secondary | ICD-10-CM | POA: Diagnosis not present

## 2019-07-05 DIAGNOSIS — R202 Paresthesia of skin: Secondary | ICD-10-CM | POA: Diagnosis not present

## 2019-07-05 DIAGNOSIS — M7711 Lateral epicondylitis, right elbow: Secondary | ICD-10-CM | POA: Diagnosis not present

## 2019-07-05 DIAGNOSIS — M25621 Stiffness of right elbow, not elsewhere classified: Secondary | ICD-10-CM | POA: Diagnosis not present

## 2019-07-05 DIAGNOSIS — M25631 Stiffness of right wrist, not elsewhere classified: Secondary | ICD-10-CM | POA: Diagnosis not present

## 2019-07-05 DIAGNOSIS — M70931 Unspecified soft tissue disorder related to use, overuse and pressure, right forearm: Secondary | ICD-10-CM | POA: Diagnosis not present

## 2019-07-08 ENCOUNTER — Telehealth: Payer: Self-pay | Admitting: Family Medicine

## 2019-07-08 DIAGNOSIS — M6281 Muscle weakness (generalized): Secondary | ICD-10-CM | POA: Diagnosis not present

## 2019-07-08 DIAGNOSIS — M25621 Stiffness of right elbow, not elsewhere classified: Secondary | ICD-10-CM | POA: Diagnosis not present

## 2019-07-08 DIAGNOSIS — R202 Paresthesia of skin: Secondary | ICD-10-CM | POA: Diagnosis not present

## 2019-07-08 DIAGNOSIS — M7711 Lateral epicondylitis, right elbow: Secondary | ICD-10-CM | POA: Diagnosis not present

## 2019-07-08 DIAGNOSIS — M25631 Stiffness of right wrist, not elsewhere classified: Secondary | ICD-10-CM | POA: Diagnosis not present

## 2019-07-08 DIAGNOSIS — M25521 Pain in right elbow: Secondary | ICD-10-CM

## 2019-07-08 DIAGNOSIS — M70931 Unspecified soft tissue disorder related to use, overuse and pressure, right forearm: Secondary | ICD-10-CM | POA: Diagnosis not present

## 2019-07-08 NOTE — Telephone Encounter (Signed)
Therapist notified me that she's not progressing well.  Visit with Dr. Amedeo Plenty not until 08-19-19.  Will order x-rays, MRI.

## 2019-07-13 ENCOUNTER — Ambulatory Visit: Payer: 59

## 2019-07-13 DIAGNOSIS — M7711 Lateral epicondylitis, right elbow: Secondary | ICD-10-CM | POA: Diagnosis not present

## 2019-07-13 DIAGNOSIS — M6281 Muscle weakness (generalized): Secondary | ICD-10-CM | POA: Diagnosis not present

## 2019-07-13 DIAGNOSIS — M25521 Pain in right elbow: Secondary | ICD-10-CM | POA: Diagnosis not present

## 2019-07-13 DIAGNOSIS — M25631 Stiffness of right wrist, not elsewhere classified: Secondary | ICD-10-CM | POA: Diagnosis not present

## 2019-07-13 DIAGNOSIS — M25621 Stiffness of right elbow, not elsewhere classified: Secondary | ICD-10-CM | POA: Diagnosis not present

## 2019-07-13 DIAGNOSIS — M70931 Unspecified soft tissue disorder related to use, overuse and pressure, right forearm: Secondary | ICD-10-CM | POA: Diagnosis not present

## 2019-07-13 DIAGNOSIS — R202 Paresthesia of skin: Secondary | ICD-10-CM | POA: Diagnosis not present

## 2019-07-14 ENCOUNTER — Encounter: Payer: 59 | Admitting: Gastroenterology

## 2019-07-15 ENCOUNTER — Encounter: Payer: Self-pay | Admitting: Family Medicine

## 2019-07-17 ENCOUNTER — Ambulatory Visit (HOSPITAL_BASED_OUTPATIENT_CLINIC_OR_DEPARTMENT_OTHER)
Admission: RE | Admit: 2019-07-17 | Discharge: 2019-07-17 | Disposition: A | Discharge status: Home / Self Care | Payer: 59 | Source: Ambulatory Visit | Attending: Family Medicine | Admitting: Family Medicine

## 2019-07-17 ENCOUNTER — Other Ambulatory Visit: Payer: Self-pay

## 2019-07-17 DIAGNOSIS — M25521 Pain in right elbow: Secondary | ICD-10-CM | POA: Diagnosis not present

## 2019-07-17 DIAGNOSIS — M25421 Effusion, right elbow: Secondary | ICD-10-CM | POA: Diagnosis not present

## 2019-07-17 DIAGNOSIS — S56511A Strain of other extensor muscle, fascia and tendon at forearm level, right arm, initial encounter: Secondary | ICD-10-CM | POA: Diagnosis not present

## 2019-07-19 ENCOUNTER — Telehealth: Payer: Self-pay | Admitting: Family Medicine

## 2019-07-19 NOTE — Telephone Encounter (Signed)
MRI shows a partial tear in the common extensor tendon.  We'll await Dr. Vanetta Shawl recommendations.

## 2019-07-24 MED FILL — FLUTICASONE PROP 50 MCG SPR: 50 | 30 days supply | Qty: 16 | Fill #0

## 2019-07-28 MED FILL — VIT D2 1.25 MG (50,000 UNIT: 1.25 MG | 28 days supply | Qty: 4 | Fill #2

## 2019-08-02 ENCOUNTER — Telehealth: Payer: Self-pay

## 2019-08-02 MED FILL — SERTRALINE HCL 100 MG TAB: 100 | 90 days supply | Qty: 90 | Fill #0

## 2019-08-02 NOTE — Telephone Encounter (Signed)
Pt aware. States she will need to reschedule her appts. Pt will call back to [phone] to reschedule the appt.

## 2019-08-02 NOTE — Telephone Encounter (Signed)
Pt called and is scheduled for an ECL with Dr. Bryan Lemma. Pt wants to know if her procedures will be screening or diagnostic. Please advise.

## 2019-08-02 NOTE — Telephone Encounter (Signed)
EGD is diagnostic. The colonoscopy can be considered screening given family history of colon cancer and she meets guideline recommendations to start screening now for increased risk.

## 2019-08-05 ENCOUNTER — Other Ambulatory Visit: Payer: Self-pay | Admitting: Gastroenterology

## 2019-08-05 ENCOUNTER — Ambulatory Visit (INDEPENDENT_AMBULATORY_CARE_PROVIDER_SITE_OTHER): Payer: 59

## 2019-08-05 DIAGNOSIS — Z1159 Encounter for screening for other viral diseases: Secondary | ICD-10-CM | POA: Diagnosis not present

## 2019-08-06 LAB — SARS CORONAVIRUS 2 (TAT 6-24 HRS): SARS Coronavirus 2: NEGATIVE

## 2019-08-07 ENCOUNTER — Ambulatory Visit
Admission: EM | Admit: 2019-08-07 | Discharge: 2019-08-07 | Disposition: A | Discharge status: Home / Self Care | Payer: 59 | Attending: Emergency Medicine | Admitting: Emergency Medicine

## 2019-08-07 ENCOUNTER — Other Ambulatory Visit: Payer: Self-pay

## 2019-08-07 DIAGNOSIS — W010XXA Fall on same level from slipping, tripping and stumbling without subsequent striking against object, initial encounter: Secondary | ICD-10-CM

## 2019-08-07 DIAGNOSIS — M79605 Pain in left leg: Secondary | ICD-10-CM | POA: Diagnosis not present

## 2019-08-07 MED ORDER — METHYLPREDNISOLONE SODIUM SUCC 125 MG IJ SOLR
80.0000 mg | Freq: Once | INTRAMUSCULAR | Status: AC
  Administered 2019-08-07: 80 mg via INTRAMUSCULAR

## 2019-08-07 MED ORDER — IBUPROFEN 800 MG PO TABS: 800.0000 mg | ORAL_TABLET | Freq: Three times a day (TID) | ORAL | 0 refills | Status: AC | PRN

## 2019-08-07 MED FILL — IBUPROFEN 800 MG TAB: 800 | 10 days supply | Qty: 30 | Fill #0

## 2019-08-07 NOTE — Discharge Instructions (Addendum)
Take ibuprofen as discussed starting tomorrow. Ice to areas of concern to help with swelling and pain. Hold nighttime dose of ibuprofen prior to colonoscopy. Go to ER for possible ultrasound if you develop persisting coldness to extremity, pallor, discoloration, worsening pain.

## 2019-08-07 NOTE — ED Provider Notes (Signed)
[facility]    CSN: [id number] Arrival date & time: 08/07/19  1107      History   Chief Complaint Chief Complaint  Patient presents with  . Leg Pain    left  . Leg Injury    HPI Lori Fisher is a 44 y.o. female with history of hyperlipidemia, obesity presenting for left leg pain.  Patient states she slipped and fell last night and landed on her left side.  Endorsing pain from her left hip down to her feet.  Denies deformity, popping sensation.  Patient states pain did not sit until 30 minutes after fall.  Denies head trauma, LOC.  Patient describes pain as "feeling well".  Worse in her left groin.  Patient states pain is worse with movement and she has noticed that her left lower leg feels slightly cooler as compared to her right.  Patient does have a sharp pain behind her knee that is worse with movement.  Able to bear weight.  Denies history of easy bruising or bleeding, DVT/PE.  Denies anticoagulant use.   Past Medical History:  Diagnosis Date  . Allergy   . Anxiety   . Depression   . History of kidney stones   . Hyperlipidemia   . Tennis elbow     Patient Active Problem List   Diagnosis Date Noted  . Vitamin D deficiency 06/09/2019  . Class 1 obesity due to excess calories without serious comorbidity with body mass index (BMI) of 32.0 to 32.9 in adult 03/14/2017  . Environmental and seasonal allergies 03/14/2017  . Generalized anxiety disorder 03/14/2017  . Pure hypercholesterolemia 03/14/2017    Past Surgical History:  Procedure Laterality Date  . EXTRACORPOREAL SHOCK WAVE LITHOTRIPSY Left 04/16/2018   Procedure: EXTRACORPOREAL SHOCK WAVE LITHOTRIPSY (ESWL);  Surgeon: Ardis Hughs, MD;  Location: [facility];  Service: Urology;  Laterality: Left;  . KIDNEY STONE SURGERY    . LITHOTRIPSY      OB History   No obstetric history on file.      Home Medications    Prior to Admission medications   Medication Sig Start Date End Date Taking?  Authorizing Provider  azelastine (ASTELIN) 0.1 % nasal spray Place 1 spray into both nostrils daily. Use in each nostril as directed 05/26/19   Cirigliano, Mary K, DO  fluticasone (FLONASE) 50 MCG/ACT nasal spray Place 2 sprays into both nostrils daily. 05/26/19   Cirigliano, Garvin Fila, DO  ibuprofen (ADVIL) 800 MG tablet Take 1 tablet (800 mg total) by mouth every 8 (eight) hours as needed. 08/07/19   Hall-Potvin, [place], PA-C  levocetirizine (XYZAL) 5 MG tablet Take 1 tablet (5 mg total) by mouth every evening. 05/26/19   Cirigliano, Mary K, DO  MELATONIN GUMMIES PO Take 5 mg by mouth.    [provider]  montelukast (SINGULAIR) 10 MG tablet Take 1 tablet (10 mg total) by mouth at bedtime. 05/26/19   CiriglianoGarvin Fila, DO  Multiple Vitamin (MULTIVITAMINS PO) Take 1 tablet by mouth daily.    [provider]  Probiotic Product (PROBIOTIC-10 PO) Take by mouth.    [provider]  sertraline (ZOLOFT) 100 MG tablet Take 1 tablet (100 mg total) by mouth daily. 05/26/19   Cirigliano, Garvin Fila, DO  traMADol (ULTRAM) 50 MG tablet TAKE 1 TABLET BY MOUTH EVERY 6 HOURS AS NEEDED. 05/31/19   Hilts, Legrand Como, MD  Vitamin D, Ergocalciferol, (DRISDOL) 1.25 MG (50000 UT) CAPS capsule Take 1 capsule (50,000 Units total) by mouth  every 7 (seven) days. 06/09/19   CiriglianoGarvin Fila, DO    Family History Family History  Problem Relation Age of Onset  . Colon cancer Father   . Colon polyps Father   . Diabetes Sister   . Hyperlipidemia Mother   . Esophageal cancer Neg Hx     Social History Social History   Tobacco Use  . Smoking status: Never Smoker  . Smokeless tobacco: Never Used  Substance Use Topics  . Alcohol use: Yes    Alcohol/week: 1.0 standard drinks    Types: 1 Glasses of wine per week  . Drug use: No     Allergies   Lactose intolerance (gi)   Review of Systems As per HPI   Physical Exam Triage Vital Signs ED Triage Vitals [08/07/19 1119]  Enc Vitals Group      BP 128/81     Pulse Rate 98     Resp 18     Temp 98.7 F (37.1 C)     Temp Source Oral     SpO2 98 %     Weight      Height      Head Circumference      Peak Flow      Pain Score      Pain Loc      Pain Edu?      Excl. in [place]?    No data found.  Updated Vital Signs BP 128/81 (BP Location: Left Arm)   Pulse 98   Temp 98.7 F (37.1 C) (Oral)   Resp 18   SpO2 98%   Visual Acuity Right Eye Distance:   Left Eye Distance:   Bilateral Distance:    Right Eye Near:   Left Eye Near:    Bilateral Near:     Physical Exam Constitutional:      General: She is not in acute distress. HENT:     Head: Normocephalic and atraumatic.  Eyes:     General: No scleral icterus.    Pupils: Pupils are equal, round, and reactive to light.  Cardiovascular:     Rate and Rhythm: Normal rate.  Pulmonary:     Effort: Pulmonary effort is normal. No respiratory distress.     Breath sounds: No wheezing.  Musculoskeletal:        General: Tenderness present. No swelling or deformity.     Right lower leg: No edema.     Left lower leg: No edema.     Comments: Legs are symmetric in length without inversion or eversion at rest.  Patient has full active ROM, though does endorse pain with full hip flexion of left hip.  Patient tender palpation over left hip flexor; spares ASIS, PSIS.  Hips are aligned and without crepitus. Left knee with popliteal tenderness: No cords appreciated, negative Homans.  No effusion, crepitus, bony tenderness. Left ankle nonedematous as compared to right without change in skin color or sensation.  Does feel mildly cool as compared to right.  Difficult to appreciate PT, DP pulses bilaterally.  Skin:    Capillary Refill: Capillary refill takes less than 2 seconds.     Coloration: Skin is not jaundiced or pale.     Findings: No bruising.  Neurological:     General: No focal deficit present.     Mental Status: She is alert and oriented to person, place, and time.      Comments: Gait mildly antalgic, favoring left.  Patient is able to bear weight on each  leg independently      UC Treatments / Results  Labs (all labs ordered are listed, but only abnormal results are displayed) Labs Reviewed - No data to display  EKG   Radiology No results found.  Procedures Procedures (including critical care time)  Medications Ordered in UC Medications  methylPREDNISolone sodium succinate (SOLU-MEDROL) 125 mg/2 mL injection 80 mg (80 mg Intramuscular Given 08/07/19 1217)    Initial Impression / Assessment and Plan / UC Course  I have reviewed the triage vital signs and the nursing notes.  Pertinent labs & imaging results that were available during my care of the patient were reviewed by me and considered in my medical decision making (see chart for details).     Patient afebrile, nontoxic in office today.  H&P consistent with musculoskeletal strain status post fall.  Low concern for hip subluxation or fracture given weightbearing status, fall from ground-level, no history of osteoporosis, and reassuring exam.  Radiography deferred at this time reconsider if having significant pain over the next few days with appropriate intervention, or worsening of gait.  Solu-Medrol given in office which patient tolerated well to help with inflammation.  Encouraged patient to increase icing and resting as well as rehab exercises.  Return precautions discussed, patient verbalized understanding and is agreeable to plan. Final Clinical Impressions(s) / UC Diagnoses   Final diagnoses:  Left leg pain     Discharge Instructions     Take ibuprofen as discussed starting tomorrow. Ice to areas of concern to help with swelling and pain. Hold nighttime dose of ibuprofen prior to colonoscopy. Go to ER for possible ultrasound if you develop persisting coldness to extremity, pallor, discoloration, worsening pain.    ED Prescriptions    Medication Sig Dispense Auth. Provider    ibuprofen (ADVIL) 800 MG tablet Take 1 tablet (800 mg total) by mouth every 8 (eight) hours as needed. 30 tablet Hall-Potvin, [place], PA-C     PDMP not reviewed this encounter.   Hall-Potvin, [place], [place] 08/08/19 [phone]

## 2019-08-07 NOTE — ED Triage Notes (Signed)
Patient is here for evaluation of left leg pain.  She reports slipping and falling down last night, landing on left side.  She has taken acetaminophen with minimal relief. No ibuprofen due to upcoming colonoscopy.

## 2019-08-10 ENCOUNTER — Ambulatory Visit: Payer: 59

## 2019-08-10 ENCOUNTER — Ambulatory Visit (AMBULATORY_SURGERY_CENTER): Payer: 59 | Admitting: Gastroenterology

## 2019-08-10 ENCOUNTER — Other Ambulatory Visit: Payer: Self-pay

## 2019-08-10 ENCOUNTER — Encounter: Payer: Self-pay | Admitting: Gastroenterology

## 2019-08-10 VITALS — BP 118/75 | HR 88 | Temp 96.9°F | Resp 15 | Ht 63.0 in | Wt 187.0 lb

## 2019-08-10 DIAGNOSIS — K5730 Diverticulosis of large intestine without perforation or abscess without bleeding: Secondary | ICD-10-CM

## 2019-08-10 DIAGNOSIS — R197 Diarrhea, unspecified: Secondary | ICD-10-CM | POA: Diagnosis not present

## 2019-08-10 DIAGNOSIS — K269 Duodenal ulcer, unspecified as acute or chronic, without hemorrhage or perforation: Secondary | ICD-10-CM | POA: Diagnosis not present

## 2019-08-10 DIAGNOSIS — K2980 Duodenitis without bleeding: Secondary | ICD-10-CM | POA: Diagnosis not present

## 2019-08-10 DIAGNOSIS — D125 Benign neoplasm of sigmoid colon: Secondary | ICD-10-CM

## 2019-08-10 DIAGNOSIS — K2970 Gastritis, unspecified, without bleeding: Secondary | ICD-10-CM

## 2019-08-10 DIAGNOSIS — K582 Mixed irritable bowel syndrome: Secondary | ICD-10-CM | POA: Diagnosis not present

## 2019-08-10 DIAGNOSIS — K2950 Unspecified chronic gastritis without bleeding: Secondary | ICD-10-CM

## 2019-08-10 DIAGNOSIS — R194 Change in bowel habit: Secondary | ICD-10-CM | POA: Diagnosis not present

## 2019-08-10 DIAGNOSIS — K635 Polyp of colon: Secondary | ICD-10-CM | POA: Diagnosis not present

## 2019-08-10 DIAGNOSIS — E7800 Pure hypercholesterolemia, unspecified: Secondary | ICD-10-CM | POA: Diagnosis not present

## 2019-08-10 DIAGNOSIS — K259 Gastric ulcer, unspecified as acute or chronic, without hemorrhage or perforation: Secondary | ICD-10-CM

## 2019-08-10 MED ORDER — PANTOPRAZOLE SODIUM 40 MG PO TBEC: 40.0000 mg | DELAYED_RELEASE_TABLET | Freq: Two times a day (BID) | ORAL | 0 refills | Status: DC

## 2019-08-10 MED ORDER — SODIUM CHLORIDE 0.9 % IV SOLN: 500.0000 mL | Freq: Once | INTRAVENOUS | Status: DC

## 2019-08-10 MED FILL — PANTOPRAZOLE SOD DR 40 MG T: 40 | 42 days supply | Qty: 84 | Fill #0

## 2019-08-10 NOTE — Progress Notes (Signed)
Called to room to assist during endoscopic procedure.  Patient ID and intended procedure confirmed with present staff. Received instructions for my participation in the procedure from the performing physician.  

## 2019-08-10 NOTE — Op Note (Signed)
[facility] Patient Name: Lori Fisher Procedure Date: 08/10/2019 11:02 AM MRN: [id number] Endoscopist: Gerrit Heck , MD Age: 44 Referring MD:  Date of Birth: 04/04/1976 Gender: Female Account #: [id number] Procedure:                Colonoscopy Indications:              Screening in patient at increased risk: Colorectal                            cancer in father before age 62                           Additionally, she presents for endoscopic                            evaluation of abdominal bloating, Diarrhea,                            Abdominal cramping, Change in bowel habits,                            Increased flatus, and intermittent hematochezia. Medicines:                Monitored Anesthesia Care Procedure:                Pre-Anesthesia Assessment:                           - Prior to the procedure, a History and Physical                            was performed, and patient medications and                            allergies were reviewed. The patient's tolerance of                            previous anesthesia was also reviewed. The risks                            and benefits of the procedure and the sedation                            options and risks were discussed with the patient.                            All questions were answered, and informed consent                            was obtained. Prior Anticoagulants: The patient has                            taken no previous anticoagulant or antiplatelet  agents. ASA Grade Assessment: II - A patient with                            mild systemic disease. After reviewing the risks                            and benefits, the patient was deemed in                            satisfactory condition to undergo the procedure.                           After obtaining informed consent, the colonoscope                            was passed under direct vision. Throughout the                        procedure, the patient's blood pressure, pulse, and                            oxygen saturations were monitored continuously. The                            Colonoscope was introduced through the anus and                            advanced to the the terminal ileum. The colonoscopy                            was performed without difficulty. The patient                            tolerated the procedure well. The quality of the                            bowel preparation was adequate. The terminal ileum,                            ileocecal valve, appendiceal orifice, and rectum                            were photographed. Scope In: 11:20:41 AM Scope Out: 11:40:46 AM Scope Withdrawal Time: 0 hours 17 minutes 23 seconds  Total Procedure Duration: 0 hours 20 minutes 5 seconds  Findings:                 The perianal and digital rectal examinations were                            normal.                           A localized area of granular mucosa was found in  the proximal ascending colon. This appeared most                            consistent with a subtle inverted diverticulum.                            There were other diverticula in the immediate                            vicinity. Biopsies were taken with a cold forceps                            for histology. Estimated blood loss was minimal.                           A 4 mm polyp was found in the sigmoid colon. The                            polyp was sessile and located inside of a                            diveritculum. The polyp was easily removed with a                            cold biopsy forceps. The diverticula bed was                            inspected and irrigated and no residual polyp                            noted. Resection and retrieval were complete.                            Estimated blood loss was minimal and stopped within                            1  minute of polypectomy.                           Multiple small and large-mouthed diverticula were                            found in the sigmoid colon and ascending colon.                           The colon mucosa was otherwise normal throughout                            the remainder of the colon. Biopsies for histology                            were taken with a cold forceps from the right colon  and left colon for evaluation of microscopic                            colitis. Estimated blood loss was minimal.                           The retroflexed view of the distal rectum and anal                            verge was normal and showed no anal or rectal                            abnormalities.                           The terminal ileum appeared normal. Complications:            No immediate complications. Estimated Blood Loss:     Estimated blood loss was minimal. Impression:               - Granular mucosa in the proximal ascending colon.                            Biopsied.                           - One 4 mm polyp in the sigmoid colon, removed with                            a cold biopsy forceps. Resected and retrieved.                           - Diverticulosis in the sigmoid colon and in the                            ascending colon.                           - Normal mucosa in the entire examined colon.                            Biopsied.                           - The distal rectum and anal verge are normal on                            retroflexion view.                           - The examined portion of the ileum was normal. Recommendation:           - Patient has a contact number available for                            emergencies. The signs and symptoms of potential  delayed complications were discussed with the                            patient. Return to normal activities tomorrow.                             Written discharge instructions were provided to the                            patient.                           - Resume previous diet.                           - Continue present medications.                           - Await pathology results.                           - Repeat colonoscopy in 5 years for surveillance                            due to family history.                           - Return to GI office PRN.                           - Use fiber, for example Citrucel, Fibercon, Konsyl                            or Metamucil. Gerrit Heck, MD 08/10/2019 11:56:03 AM

## 2019-08-10 NOTE — Progress Notes (Signed)
Pt's states no medical or surgical changes since previsit or office visit.  DC iv,LC  Temp and CW vitals.

## 2019-08-10 NOTE — Progress Notes (Signed)
1111 Severe OSA with jaw thrust performed, O2 sat returned >90%.  vss

## 2019-08-10 NOTE — Op Note (Signed)
[name] Patient Name: Lori Fisher Procedure Date: 08/10/2019 11:02 AM MRN: [id number] Endoscopist: Gerrit Heck , MD Age: 44 Referring MD:  Date of Birth: 04/18/1976 Gender: Female Account #: [id number] Procedure:                Upper GI endoscopy Indications:              Abdominal bloating, Diarrhea, Abdominal cramping,                            Change in bowel habits, Increased flatus, Vitamin D                            deficiency Medicines:                Monitored Anesthesia Care Procedure:                Pre-Anesthesia Assessment:                           - Prior to the procedure, a History and Physical                            was performed, and patient medications and                            allergies were reviewed. The patient's tolerance of                            previous anesthesia was also reviewed. The risks                            and benefits of the procedure and the sedation                            options and risks were discussed with the patient.                            All questions were answered, and informed consent                            was obtained. Prior Anticoagulants: The patient has                            taken no previous anticoagulant or antiplatelet                            agents. ASA Grade Assessment: II - A patient with                            mild systemic disease. After reviewing the risks                            and benefits, the patient was deemed in  satisfactory condition to undergo the procedure.                           After obtaining informed consent, the endoscope was                            passed under direct vision. Throughout the                            procedure, the patient's blood pressure, pulse, and                            oxygen saturations were monitored continuously. The                            Endoscope was introduced through the mouth,  and                            advanced to the second part of duodenum. The upper                            GI endoscopy was accomplished without difficulty.                            The patient tolerated the procedure well. Scope In: Scope Out: Findings:                 The examined esophagus was normal.                           One non-bleeding superficial gastric ulcer with no                            stigmata of bleeding was found in the gastric                            antrum. The lesion was 3 mm in largest dimension.                            Biopsies were taken with a cold forceps for                            histology. Estimated blood loss was minimal.                           Scattered mild inflammation characterized by                            erythema was found in the gastric body and in the                            gastric antrum. Biopsies were taken with a cold  forceps for Helicobacter pylori testing. Estimated                            blood loss was minimal.                           One non-bleeding superficial duodenal ulcer with no                            stigmata of bleeding was found in the duodenal                            bulb. The lesion was 4 mm in largest dimension.                            Biopsies were taken with a cold forceps for                            histology. Estimated blood loss was minimal.                           The second portion of the duodenum was normal.                            Biopsies for histology were taken with a cold                            forceps for evaluation of celiac disease. Estimated                            blood loss was minimal. Complications:            No immediate complications. Estimated Blood Loss:     Estimated blood loss was minimal. Impression:               - Normal esophagus.                           - Non-bleeding gastric ulcer with no stigmata of                             bleeding. Biopsied.                           - Gastritis. Biopsied.                           - Non-bleeding duodenal ulcer with no stigmata of                            bleeding. Biopsied.                           - Normal second portion of the duodenum. Biopsied. Recommendation:           - Patient has a contact number available for  emergencies. The signs and symptoms of potential                            delayed complications were discussed with the                            patient. Return to normal activities tomorrow.                            Written discharge instructions were provided to the                            patient.                           - Resume previous diet.                           - Continue present medications.                           - Await pathology results.                           - Use Protonix (pantoprazole) 40 mg PO BID for 6                            weeks to promote mucosal healing.                           - No aspirin, ibuprofen, naproxen, or other                            non-steroidal anti-inflammatory drugs.                           - Colonoscopy today. Gerrit Heck, MD 08/10/2019 11:48:35 AM

## 2019-08-10 NOTE — Progress Notes (Signed)
Report given to PACU, vss 

## 2019-08-10 NOTE — Patient Instructions (Signed)
Handouts on polyps, gastritis, and ulcers given to you today  Await pathology results NO ASPIRIN, ASPIRIN CONTAINING PRODUCTS (BC OR GOODY POWDERS) OR NSAIDS (IBUPROFEN, ADVIL, ALEVE, AND MOTRIN); TYLENOL IS OK TO TAKE Start Pantopazole 40mg  twice a day for 6 weeks     YOU HAD AN ENDOSCOPIC PROCEDURE TODAY AT [facility]:   Refer to the procedure report that was given to you for any specific questions about what was found during the examination.  If the procedure report does not answer your questions, please call your gastroenterologist to clarify.  If you requested that your care partner not be given the details of your procedure findings, then the procedure report has been included in a sealed envelope for you to review at your convenience later.  YOU SHOULD EXPECT: Some feelings of bloating in the abdomen. Passage of more gas than usual.  Walking can help get rid of the air that was put into your GI tract during the procedure and reduce the bloating. If you had a lower endoscopy (such as a colonoscopy or flexible sigmoidoscopy) you may notice spotting of blood in your stool or on the toilet paper. If you underwent a bowel prep for your procedure, you may not have a normal bowel movement for a few days.  Please Note:  You might notice some irritation and congestion in your nose or some drainage.  This is from the oxygen used during your procedure.  There is no need for concern and it should clear up in a day or so.  SYMPTOMS TO REPORT IMMEDIATELY:   Following lower endoscopy (colonoscopy or flexible sigmoidoscopy):  Excessive amounts of blood in the stool  Significant tenderness or worsening of abdominal pains  Swelling of the abdomen that is new, acute  Fever of 100F or higher   Following upper endoscopy (EGD)  Vomiting of blood or coffee ground material  New chest pain or pain under the shoulder blades  Painful or persistently difficult swallowing  New shortness of  breath  Fever of 100F or higher  Black, tarry-looking stools  For urgent or emergent issues, a gastroenterologist can be reached at any hour by calling [phone].   DIET:  We do recommend a small meal at first, but then you may proceed to your regular diet.  Drink plenty of fluids but you should avoid alcoholic beverages for 24 hours.  ACTIVITY:  You should plan to take it easy for the rest of today and you should NOT DRIVE or use heavy machinery until tomorrow (because of the sedation medicines used during the test).    FOLLOW UP: Our staff will call the number listed on your records 48-72 hours following your procedure to check on you and address any questions or concerns that you may have regarding the information given to you following your procedure. If we do not reach you, we will leave a message.  We will attempt to reach you two times.  During this call, we will ask if you have developed any symptoms of COVID 19. If you develop any symptoms (ie: fever, flu-like symptoms, shortness of breath, cough etc.) before then, please call [phone].  If you test positive for Covid 19 in the 2 weeks post procedure, please call and report this information to [place].    If any biopsies were taken you will be contacted by phone or by letter within the next 1-3 weeks.  Please call us at [phone] if you have not heard about  the biopsies in 3 weeks.    SIGNATURES/CONFIDENTIALITY: You and/or your care partner have signed paperwork which will be entered into your electronic medical record.  These signatures attest to the fact that that the information above on your After Visit Summary has been reviewed and is understood.  Full responsibility of the confidentiality of this discharge information lies with you and/or your care-partner.

## 2019-08-11 MED FILL — AZELASTINE HCL 137 MCG SPRY: 0.1 | 90 days supply | Qty: 30 | Fill #0

## 2019-08-12 ENCOUNTER — Telehealth: Payer: Self-pay

## 2019-08-12 NOTE — Telephone Encounter (Signed)
  Follow up Call-  Call back number 08/10/2019  Post procedure Call Back phone  # [phone]  Permission to leave phone message Yes  Some recent data might be hidden     Patient questions:  Do you have a fever, pain , or abdominal swelling? No. Pain Score  0 *  Have you tolerated food without any problems? Yes.    Have you been able to return to your normal activities? Yes.    Do you have any questions about your discharge instructions: Diet   No. Medications  No. Follow up visit  No.  Do you have questions or concerns about your Care? No.  Actions: * If pain score is 4 or above: No action needed, pain <4.  1. Have you developed a fever since your procedure? no  2.   Have you had an respiratory symptoms (SOB or cough) since your procedure? no 3.   Have you tested positive for COVID 19 since your procedure no  4.   Have you had any family members/close contacts diagnosed with the COVID 19 since your procedure?  no   If yes to any of these questions please route to Joylene John, RN and Alphonsa Gin, Therapist, sports.

## 2019-08-13 ENCOUNTER — Encounter: Payer: Self-pay | Admitting: Family Medicine

## 2019-08-15 MED FILL — IBUPROFEN 800 MG TAB: 800 | 30 days supply | Qty: 90 | Fill #1

## 2019-08-17 ENCOUNTER — Telehealth: Payer: Self-pay | Admitting: Family Medicine

## 2019-08-17 NOTE — Telephone Encounter (Signed)
Patient called to check if we received request for records from [facility]. I advised patient we have not. She stated they need records 03/20/2019 to present. We do have auth on file. I faxed records and oow notes [phone]

## 2019-08-18 ENCOUNTER — Encounter: Payer: Self-pay | Admitting: Gastroenterology

## 2019-08-18 MED FILL — LEVOCETIRIZINE 5 MG TABLET: 5 | 90 days supply | Qty: 90 | Fill #1

## 2019-08-18 MED FILL — MONTELUKAST SOD 10 MG TAB: 10 | 90 days supply | Qty: 90 | Fill #1

## 2019-08-19 DIAGNOSIS — M7711 Lateral epicondylitis, right elbow: Secondary | ICD-10-CM | POA: Diagnosis not present

## 2019-08-19 DIAGNOSIS — M25521 Pain in right elbow: Secondary | ICD-10-CM | POA: Diagnosis not present

## 2019-08-19 MED FILL — FLUTICASONE PROP 50 MCG SPR: 50 | 30 days supply | Qty: 16 | Fill #1

## 2019-08-25 MED FILL — VIT D2 1.25 MG (50,000 UNIT: 1.25 MG | 21 days supply | Qty: 3 | Fill #3

## 2019-09-08 DIAGNOSIS — J3089 Other allergic rhinitis: Secondary | ICD-10-CM | POA: Diagnosis not present

## 2019-09-08 DIAGNOSIS — H1045 Other chronic allergic conjunctivitis: Secondary | ICD-10-CM | POA: Diagnosis not present

## 2019-09-15 ENCOUNTER — Other Ambulatory Visit: Payer: Self-pay | Admitting: Family Medicine

## 2019-09-15 ENCOUNTER — Other Ambulatory Visit: Payer: Self-pay | Admitting: Gastroenterology

## 2019-09-15 DIAGNOSIS — K269 Duodenal ulcer, unspecified as acute or chronic, without hemorrhage or perforation: Secondary | ICD-10-CM

## 2019-09-15 DIAGNOSIS — K259 Gastric ulcer, unspecified as acute or chronic, without hemorrhage or perforation: Secondary | ICD-10-CM

## 2019-09-15 DIAGNOSIS — E559 Vitamin D deficiency, unspecified: Secondary | ICD-10-CM

## 2019-09-16 MED FILL — FLUTICASONE PROP 50 MCG SPR: 50 | 30 days supply | Qty: 16 | Fill #2

## 2019-09-28 ENCOUNTER — Other Ambulatory Visit: Payer: Self-pay | Admitting: Gastroenterology

## 2019-09-28 DIAGNOSIS — K259 Gastric ulcer, unspecified as acute or chronic, without hemorrhage or perforation: Secondary | ICD-10-CM

## 2019-09-28 DIAGNOSIS — K269 Duodenal ulcer, unspecified as acute or chronic, without hemorrhage or perforation: Secondary | ICD-10-CM

## 2019-10-12 ENCOUNTER — Ambulatory Visit: Payer: 59

## 2019-10-14 ENCOUNTER — Encounter: Payer: Self-pay | Admitting: Family Medicine

## 2019-10-14 ENCOUNTER — Telehealth (INDEPENDENT_AMBULATORY_CARE_PROVIDER_SITE_OTHER): Payer: 59 | Admitting: Family Medicine

## 2019-10-14 VITALS — Temp 99.0°F | Ht 63.0 in | Wt 180.0 lb

## 2019-10-14 DIAGNOSIS — J0100 Acute maxillary sinusitis, unspecified: Secondary | ICD-10-CM

## 2019-10-14 DIAGNOSIS — J3089 Other allergic rhinitis: Secondary | ICD-10-CM

## 2019-10-14 MED ORDER — METHYLPREDNISOLONE 4 MG PO TBPK: ORAL_TABLET | ORAL | 0 refills | Status: DC

## 2019-10-14 MED ORDER — AMOXICILLIN-POT CLAVULANATE 875-125 MG PO TABS: 1.0000 | ORAL_TABLET | Freq: Two times a day (BID) | ORAL | 0 refills | Status: AC

## 2019-10-14 NOTE — Progress Notes (Signed)
Virtual Visit via Video Note  I connected with Lori Fisher on 10/14/19 at 11:30 AM EDT by a video enabled telemedicine application and verified that I am speaking with the correct person using two identifiers. Location patient: home Location provider: work  Persons participating in the virtual visit: patient, provider  I discussed the limitations of evaluation and management by telemedicine and the availability of in person appointments. The patient expressed understanding and agreed to proceed.  Chief Complaint  Patient presents with  . Sinusitis    Pt c/o of having allergies and low grade fever, sinus pressure, headache, glands on the rt side tender, x 1 week      HPI: Lori Fisher is a 44 y.o. female who complains of 1 week h/o low grade fever (Tmax 99), head congestion, nasal congestion, sinus pressure, PND.  No SOB, minimal cough likely related to PND. Pt has a h/o allergies and takes xyzal and singulair. She has been using flonase x 2 days. She has also been taking advil cold and sinus. She has mucinex but has not been taking it.    Past Medical History:  Diagnosis Date  . Allergy   . Anxiety   . Depression   . History of kidney stones   . Hyperlipidemia   . Tennis elbow     Past Surgical History:  Procedure Laterality Date  . EXTRACORPOREAL SHOCK WAVE LITHOTRIPSY Left 04/16/2018   Procedure: EXTRACORPOREAL SHOCK WAVE LITHOTRIPSY (ESWL);  Surgeon: Ardis Hughs, MD;  Location: [facility];  Service: Urology;  Laterality: Left;  . KIDNEY STONE SURGERY    . LITHOTRIPSY      Family History  Problem Relation Age of Onset  . Colon cancer Father   . Colon polyps Father   . Diabetes Sister   . Hyperlipidemia Mother   . Esophageal cancer Neg Hx   . Rectal cancer Neg Hx   . Prostate cancer Neg Hx     Social History   Tobacco Use  . Smoking status: Never Smoker  . Smokeless tobacco: Never Used  Substance Use Topics  . Alcohol use: Yes    Alcohol/week: 1.0 standard  drinks    Types: 1 Glasses of wine per week  . Drug use: No     Current Outpatient Medications:  .  azelastine (ASTELIN) 0.1 % nasal spray, Place 1 spray into both nostrils daily. Use in each nostril as directed, Disp: 30 mL, Rfl: 5 .  fluticasone (FLONASE) 50 MCG/ACT nasal spray, Place 2 sprays into both nostrils daily., Disp: 16 g, Rfl: 3 .  ibuprofen (ADVIL) 800 MG tablet, Take 1 tablet (800 mg total) by mouth every 8 (eight) hours as needed., Disp: 30 tablet, Rfl: 0 .  levocetirizine (XYZAL) 5 MG tablet, Take 1 tablet (5 mg total) by mouth every evening., Disp: 90 tablet, Rfl: 3 .  MELATONIN GUMMIES PO, Take 5 mg by mouth., Disp: , Rfl:  .  montelukast (SINGULAIR) 10 MG tablet, Take 1 tablet (10 mg total) by mouth at bedtime., Disp: 90 tablet, Rfl: 3 .  Multiple Vitamin (MULTIVITAMINS PO), Take 1 tablet by mouth daily., Disp: , Rfl:  .  Probiotic Product (PROBIOTIC-10 PO), Take by mouth., Disp: , Rfl:  .  sertraline (ZOLOFT) 100 MG tablet, Take 1 tablet (100 mg total) by mouth daily., Disp: 90 tablet, Rfl: 3 .  Vitamin D, Ergocalciferol, (DRISDOL) 1.25 MG (50000 UT) CAPS capsule, Take 1 capsule (50,000 Units total) by mouth every 7 (seven) days., Disp: 5 capsule,  Rfl: 2 .  pantoprazole (PROTONIX) 40 MG tablet, Take 1 tablet (40 mg total) by mouth 2 (two) times daily., Disp: 84 tablet, Rfl: 0 .  traMADol (ULTRAM) 50 MG tablet, TAKE 1 TABLET BY MOUTH EVERY 6 HOURS AS NEEDED. (Patient not taking: Reported on 08/10/2019), Disp: 20 tablet, Rfl: 0  Allergies  Allergen Reactions  . Lactose Intolerance (Gi)       ROS: See pertinent positives and negatives per HPI.   EXAM:  VITALS per patient if applicable: Temp 99 F (0000000 C) (Oral)   Ht 5\' 3"  (1.6 m)   Wt 180 lb (81.6 kg)   LMP 09/25/2019   BMI 31.89 kg/m    GENERAL: alert, oriented, and in no acute distress  HEENT: atraumatic, conjunctiva clear, no obvious abnormalities on inspection of external nose and ears  NECK: normal  movements of the head and neck  LUNGS: on inspection no signs of respiratory distress, breathing rate appears normal, no obvious gross SOB, gasping or wheezing, no conversational dyspnea  CV: no obvious cyanosis  PSYCH/NEURO: pleasant and cooperative, speech and thought processing grossly intact   ASSESSMENT AND PLAN:  1. Acute non-recurrent maxillary sinusitis 2. Environmental and seasonal allergies - cont with nasal saline spray, flonase, xyzal, singulair - can add mucinex 1 tab BID Rx: - amoxicillin-clavulanate (AUGMENTIN) 875-125 MG tablet; Take 1 tablet by mouth 2 (two) times daily for 7 days.  Dispense: 14 tablet; Refill: 0 - methylPREDNISolone (MEDROL DOSEPAK) 4 MG TBPK tablet; Take as directed  Dispense: 21 tablet; Refill: 0 - f/u if symptoms worsen or do not improve in 7-10 days Discussed plan and reviewed medications with patient, including risks, benefits, and potential side effects. Pt expressed understand. All questions answered.    I discussed the assessment and treatment plan with the patient. The patient was provided an opportunity to ask questions and all were answered. The patient agreed with the plan and demonstrated an understanding of the instructions.   The patient was advised to call back or seek an in-person evaluation if the symptoms worsen or if the condition fails to improve as anticipated.   Letta Median, DO

## 2019-10-16 MED FILL — FLUTICASONE PROP 50 MCG SPR: 50 | 30 days supply | Qty: 16 | Fill #3

## 2019-10-25 ENCOUNTER — Encounter: Payer: Self-pay | Admitting: Family Medicine

## 2019-10-25 MED ORDER — FLUCONAZOLE 150 MG PO TABS
150.0000 mg | ORAL_TABLET | Freq: Every day | ORAL | 0 refills | Status: DC
Start: 2019-10-25 — End: 2020-06-01

## 2019-10-25 MED FILL — SERTRALINE HCL 100 MG TAB: 100 | 90 days supply | Qty: 90 | Fill #1

## 2019-10-25 NOTE — Addendum Note (Signed)
Addended by: Leana Gamer on: 10/25/2019 04:38 PM   Modules accepted: Orders

## 2019-10-25 NOTE — Telephone Encounter (Signed)
Please see message and advise].  Pt had rx for amoxicillin 7days, on 10/14/19.

## 2019-11-03 MED FILL — AZELASTINE HCL 137 MCG SPRY: 0.1 | 90 days supply | Qty: 30 | Fill #1

## 2019-11-12 ENCOUNTER — Ambulatory Visit
Admission: RE | Admit: 2019-11-12 | Discharge: 2019-11-12 | Disposition: A | Discharge status: Home / Self Care | Payer: BC Managed Care – PPO | Source: Ambulatory Visit | Attending: Family Medicine | Admitting: Family Medicine

## 2019-11-12 ENCOUNTER — Other Ambulatory Visit: Payer: Self-pay

## 2019-11-12 DIAGNOSIS — Z1231 Encounter for screening mammogram for malignant neoplasm of breast: Secondary | ICD-10-CM

## 2019-11-16 ENCOUNTER — Other Ambulatory Visit: Payer: Self-pay | Admitting: Family Medicine

## 2019-11-16 DIAGNOSIS — J3089 Other allergic rhinitis: Secondary | ICD-10-CM

## 2019-11-16 NOTE — Telephone Encounter (Signed)
Last OV 10/14/19 Last fill 05/25/20  #16g/3

## 2019-12-10 MED FILL — FLUTICASONE PROP 50 MCG SPR: 50 | 30 days supply | Qty: 16 | Fill #1

## 2020-01-09 MED FILL — FLUTICASONE PROP 50 MCG SPR: 50 | 30 days supply | Qty: 16 | Fill #2

## 2020-01-24 MED FILL — SERTRALINE HCL 100 MG TABS: 100 | 90 days supply | Qty: 90 | Fill #2

## 2020-02-01 MED FILL — AZELASTINE HCL 137 MCG/SPRA: 137 | 90 days supply | Qty: 30 | Fill #2

## 2020-02-08 MED FILL — FLUTICASONE PROP 50 MCG SPR: 50 | 30 days supply | Qty: 16 | Fill #3

## 2020-02-16 MED FILL — LEVOCETIRIZINE 5 MG TABLET: 5 | 90 days supply | Qty: 90 | Fill #3

## 2020-02-16 MED FILL — MONTELUKAST SOD 10 MG TAB: 10 | 90 days supply | Qty: 90 | Fill #3

## 2020-03-09 ENCOUNTER — Other Ambulatory Visit: Payer: Self-pay | Admitting: Family Medicine

## 2020-03-09 DIAGNOSIS — J3089 Other allergic rhinitis: Secondary | ICD-10-CM

## 2020-03-09 MED FILL — FLUTICASONE PROP 50 MCG SPR: 50 | 30 days supply | Qty: 16 | Fill #0

## 2020-04-03 MED FILL — FLUTICASONE PROP 50 MCG SPR: 50 | 30 days supply | Qty: 16 | Fill #1

## 2020-04-22 MED FILL — SERTRALINE HCL 100 MG TABS: 100 | 90 days supply | Qty: 90 | Fill #3

## 2020-05-01 MED FILL — AZELASTINE HCL 137 MCG/SPRA: 137 | 90 days supply | Qty: 30 | Fill #3

## 2020-05-02 MED FILL — FLUTICASONE PROP 50 MCG SPR: 50 | 30 days supply | Qty: 16 | Fill #2

## 2020-05-15 ENCOUNTER — Other Ambulatory Visit: Payer: Self-pay | Admitting: Family Medicine

## 2020-05-15 DIAGNOSIS — J3089 Other allergic rhinitis: Secondary | ICD-10-CM

## 2020-05-15 MED FILL — MONTELUKAST SOD 10 MG TAB: 10 | 90 days supply | Qty: 90 | Fill #0

## 2020-05-15 MED FILL — LEVOCETIRIZINE 5 MG TABLET: 5 | 90 days supply | Qty: 90 | Fill #0

## 2020-05-20 DIAGNOSIS — F4323 Adjustment disorder with mixed anxiety and depressed mood: Secondary | ICD-10-CM | POA: Diagnosis not present

## 2020-05-28 ENCOUNTER — Encounter: Payer: Self-pay | Admitting: Family Medicine

## 2020-06-01 ENCOUNTER — Encounter: Payer: Self-pay | Admitting: Family Medicine

## 2020-06-01 ENCOUNTER — Other Ambulatory Visit: Payer: Self-pay

## 2020-06-01 ENCOUNTER — Other Ambulatory Visit: Payer: Self-pay | Admitting: Family Medicine

## 2020-06-01 ENCOUNTER — Ambulatory Visit (INDEPENDENT_AMBULATORY_CARE_PROVIDER_SITE_OTHER): Payer: 59 | Admitting: Family Medicine

## 2020-06-01 VITALS — BP 120/74 | HR 90 | Temp 97.4°F | Ht 63.0 in | Wt 190.0 lb

## 2020-06-01 DIAGNOSIS — J3089 Other allergic rhinitis: Secondary | ICD-10-CM | POA: Diagnosis not present

## 2020-06-01 DIAGNOSIS — E559 Vitamin D deficiency, unspecified: Secondary | ICD-10-CM

## 2020-06-01 DIAGNOSIS — E7800 Pure hypercholesterolemia, unspecified: Secondary | ICD-10-CM | POA: Diagnosis not present

## 2020-06-01 DIAGNOSIS — Z0000 Encounter for general adult medical examination without abnormal findings: Secondary | ICD-10-CM | POA: Diagnosis not present

## 2020-06-01 DIAGNOSIS — F411 Generalized anxiety disorder: Secondary | ICD-10-CM

## 2020-06-01 LAB — LIPID PANEL
Cholesterol: 272 mg/dL — ABNORMAL HIGH (ref 0–200)
HDL: 46.9 mg/dL (ref >39.00)
NonHDL: 224.72
Total CHOL/HDL Ratio: 6
Triglycerides: 260 mg/dL — ABNORMAL HIGH (ref 0.0–149.0)
VLDL: 52 mg/dL — ABNORMAL HIGH (ref 0.0–40.0)

## 2020-06-01 LAB — BASIC METABOLIC PANEL
BUN: 10 mg/dL (ref 6–23)
CO2: 31 mEq/L (ref 19–32)
Calcium: 9.4 mg/dL (ref 8.4–10.5)
Chloride: 101 mEq/L (ref 96–112)
Creatinine, Ser: 0.54 mg/dL (ref 0.40–1.20)
GFR: 111.28 mL/min (ref >60.00)
Glucose, Bld: 88 mg/dL (ref 70–99)
Potassium: 4.6 mEq/L (ref 3.5–5.1)
Sodium: 139 mEq/L (ref 135–145)

## 2020-06-01 LAB — CBC
HCT: 39.5 % (ref 36.0–46.0)
Hemoglobin: 13.4 g/dL (ref 12.0–15.0)
MCHC: 34 g/dL (ref 30.0–36.0)
MCV: 86.5 fl (ref 78.0–100.0)
Platelets: 246 10*3/uL (ref 150.0–400.0)
RBC: 4.56 Mil/uL (ref 3.87–5.11)
RDW: 14.4 % (ref 11.5–15.5)
WBC: 8.8 10*3/uL (ref 4.0–10.5)

## 2020-06-01 LAB — ALT: ALT: 50 U/L — ABNORMAL HIGH (ref 0–35)

## 2020-06-01 LAB — AST: AST: 88 U/L — ABNORMAL HIGH (ref 0–37)

## 2020-06-01 LAB — LDL CHOLESTEROL, DIRECT: Direct LDL: 199 mg/dL

## 2020-06-01 LAB — VITAMIN D 25 HYDROXY (VIT D DEFICIENCY, FRACTURES): VITD: 34.69 ng/mL (ref 30.00–100.00)

## 2020-06-01 MED ORDER — SERTRALINE HCL 100 MG PO TABS: 100.0000 mg | ORAL_TABLET | Freq: Every day | ORAL | 3 refills | Status: DC

## 2020-06-01 MED ORDER — MONTELUKAST SODIUM 10 MG PO TABS
10.0000 mg | ORAL_TABLET | Freq: Every day | ORAL | 3 refills | Status: DC
  Filled 2020-11-22: qty 90, 90d supply, fill #0
  Filled 2021-01-16: qty 90, 90d supply, fill #1

## 2020-06-01 MED ORDER — AZELASTINE HCL 0.1 % NA SOLN: 1.0000 | Freq: Every day | NASAL | 5 refills | Status: DC

## 2020-06-01 MED FILL — FLUTICASONE PROP 50 MCG SPR: 50 | 30 days supply | Qty: 16 | Fill #3

## 2020-06-01 NOTE — Patient Instructions (Signed)
Coconut oil  Evening primrose oil capsules Black cohosh  Soy products Estroven OTC supplement

## 2020-06-01 NOTE — Progress Notes (Signed)
Lori Fisher is a 44 y.o. female  Chief Complaint  Patient presents with  . Annual Exam    CPE/labs. Fasting today.  C/o having a low sex drive.     HPI:  Lori Fisher is a 44 y.o. female seen today for annual CPE, fasting labs.  Pr complains of decreased and no sex drive, worse in the past year. She notes some discomfort/pain with sex. She had intake appt with new [facility] counselor last week.  She has been on sertraline x 10+ years. This works well and pt does not want to stop or switch med.  Last PAP: 09/2015 - due in 2022 Last mammo: 11/2019 Last colonoscopy: 08/2019 - Dr. Gerrit Heck - due in 08/2024 (d/t fam hx)  Dental: due  Vision: wears glasses and due for exam  Med refills needed today? Yes see orders   Past Medical History:  Diagnosis Date  . Allergy   . Anxiety   . Depression   . History of kidney stones   . Hyperlipidemia   . Tennis elbow     Past Surgical History:  Procedure Laterality Date  . EXTRACORPOREAL SHOCK WAVE LITHOTRIPSY Left 04/16/2018   Procedure: EXTRACORPOREAL SHOCK WAVE LITHOTRIPSY (ESWL);  Surgeon: Ardis Hughs, MD;  Location: [facility];  Service: Urology;  Laterality: Left;  . KIDNEY STONE SURGERY    . LITHOTRIPSY      Social History   Socioeconomic History  . Marital status: Married    Spouse name: Not on file  . Number of children: Not on file  . Years of education: Not on file  . Highest education level: Not on file  Occupational History  . Occupation: vascular sonography  Tobacco Use  . Smoking status: Never Smoker  . Smokeless tobacco: Never Used  Vaping Use  . Vaping Use: Never used  Substance and Sexual Activity  . Alcohol use: Yes    Alcohol/week: 1.0 standard drink    Types: 1 Glasses of wine per week  . Drug use: No  . Sexual activity: Yes  Other Topics Concern  . Not on file  Social History Narrative  . Not on file   Social Determinants of Health   Financial Resource Strain: Not on file  Food Insecurity: Not  on file  Transportation Needs: Not on file  Physical Activity: Not on file  Stress: Not on file  Social Connections: Not on file  Intimate Partner Violence: Not on file    Family History  Problem Relation Age of Onset  . Colon cancer Father   . Colon polyps Father   . Diabetes Sister   . Hyperlipidemia Mother   . Esophageal cancer Neg Hx   . Rectal cancer Neg Hx   . Prostate cancer Neg Hx      Immunization History  Administered Date(s) Administered  . Influenza,inj,Quad PF,6+ Mos 03/10/2012  . Influenza-Unspecified 03/10/2017, 03/10/2019, 03/10/2020  . MMR 03/11/2014  . PFIZER SARS-COV-2 Vaccination 07/30/2019, 08/20/2019, 03/31/2020  . Tdap 05/22/2018    Outpatient Encounter Medications as of 06/01/2020  Medication Sig Note  . Cholecalciferol (VITAMIN D3) 125 MCG (5000 UT) TABS Take 1 tablet by mouth.   . fluticasone (FLONASE) 50 MCG/ACT nasal spray PLACE 2 SPRAYS INTO BOTH NOSTRILS DAILY.   Marland Kitchen ibuprofen (ADVIL) 800 MG tablet Take 1 tablet (800 mg total) by mouth every 8 (eight) hours as needed.   Marland Kitchen levocetirizine (XYZAL) 5 MG tablet TAKE 1 TABLET (5 MG TOTAL) BY MOUTH EVERY EVENING.   Marland Kitchen  MELATONIN GUMMIES PO Take 5 mg by mouth.   . Multiple Vitamin (MULTIVITAMINS PO) Take 1 tablet by mouth daily.   . Probiotic Product (PROBIOTIC-10 PO) Take by mouth.   . [DISCONTINUED] azelastine (ASTELIN) 0.1 % nasal spray Place 1 spray into both nostrils daily. Use in each nostril as directed   . [DISCONTINUED] montelukast (SINGULAIR) 10 MG tablet TAKE 1 TABLET (10 MG TOTAL) BY MOUTH AT BEDTIME.   . [DISCONTINUED] sertraline (ZOLOFT) 100 MG tablet Take 1 tablet (100 mg total) by mouth daily.   Marland Kitchen azelastine (ASTELIN) 0.1 % nasal spray Place 1 spray into both nostrils daily. Use in each nostril as directed   . montelukast (SINGULAIR) 10 MG tablet Take 1 tablet (10 mg total) by mouth at bedtime.   . sertraline (ZOLOFT) 100 MG tablet Take 1 tablet (100 mg total) by mouth daily.   .  [DISCONTINUED] fluconazole (DIFLUCAN) 150 MG tablet Take 1 tablet (150 mg total) by mouth daily. Take second tab 3days apart from first tab   . [DISCONTINUED] methylPREDNISolone (MEDROL DOSEPAK) 4 MG TBPK tablet Take as directed   . [DISCONTINUED] pantoprazole (PROTONIX) 40 MG tablet Take 1 tablet (40 mg total) by mouth 2 (two) times daily.   . [DISCONTINUED] traMADol (ULTRAM) 50 MG tablet TAKE 1 TABLET BY MOUTH EVERY 6 HOURS AS NEEDED. (Patient not taking: Reported on 08/10/2019) 08/07/2019: Patient reports not currently taking  . [DISCONTINUED] Vitamin D, Ergocalciferol, (DRISDOL) 1.25 MG (50000 UT) CAPS capsule Take 1 capsule (50,000 Units total) by mouth every 7 (seven) days.    No facility-administered encounter medications on file as of 06/01/2020.     ROS: Gen: no fever, chills  Skin: no rash, itching ENT: no ear pain, ear drainage, nasal congestion, rhinorrhea, sinus pressure, sore throat Eyes: no blurry vision, double vision Resp: no cough, wheeze,SOB CV: no CP, palpitations, LE edema,  GI: no heartburn, n/v/d/c, abd pain GU: no dysuria, urgency, frequency, hematuria MSK: no joint pain, myalgias, back pain Neuro: no dizziness, headache, weakness, vertigo Psych: no depression, anxiety, insomnia   Allergies  Allergen Reactions  . Lactose Intolerance (Gi)     BP 120/74   Pulse 90   Temp (!) 97.4 F (36.3 C) (Temporal)   Ht '5\' 3"'  (1.6 m)   Wt 190 lb (86.2 kg)   SpO2 96%   BMI 33.66 kg/m   Physical Exam Constitutional:      General: She is not in acute distress.    Appearance: She is well-developed and well-nourished.  HENT:     Head: Normocephalic and atraumatic.     Right Ear: Tympanic membrane and ear canal normal.     Left Ear: Tympanic membrane and ear canal normal.     Nose: Nose normal.     Mouth/Throat:     Mouth: Oropharynx is clear and moist and mucous membranes are normal.  Eyes:     Conjunctiva/sclera: Conjunctivae normal.     Pupils: Pupils are equal,  round, and reactive to light.  Neck:     Thyroid: No thyromegaly.  Cardiovascular:     Rate and Rhythm: Normal rate and regular rhythm.     Pulses: Intact distal pulses.     Heart sounds: Normal heart sounds. No murmur heard.   Pulmonary:     Effort: Pulmonary effort is normal. No respiratory distress.     Breath sounds: Normal breath sounds. No wheezing or rhonchi.  Abdominal:     General: Bowel sounds are normal. There is no  distension.     Palpations: Abdomen is soft. There is no mass.     Tenderness: There is no abdominal tenderness.  Musculoskeletal:        General: No edema.     Cervical back: Neck supple.     Right lower leg: No edema.     Left lower leg: No edema.  Lymphadenopathy:     Cervical: No cervical adenopathy.  Skin:    General: Skin is warm and dry.  Neurological:     Mental Status: She is alert and oriented to person, place, and time.     Motor: No abnormal muscle tone.     Coordination: Coordination normal.  Psychiatric:        Mood and Affect: Mood and affect normal.        Behavior: Behavior normal.      A/P:  1. Annual physical exam - discussed importance of regular CV exercise, healthy diet, adequate sleep - due for dental and vision exams - UTD on PAP, mammo, colonoscopy - immunizations UTD - ALT - AST - Basic metabolic panel - CBC - Lipid panel - next CPE in 1 year  2. Vitamin D deficiency - pt taking 5000IU daily - VITAMIN D 25 Hydroxy (Vit-D Deficiency, Fractures)  3. Pure hypercholesterolemia - not on medication - ALT - AST - Lipid panel  4. Generalized anxiety disorder - stable, well-controlled Refill: - sertraline (ZOLOFT) 100 MG tablet; Take 1 tablet (100 mg total) by mouth daily.  Dispense: 90 tablet; Refill: 3  5. Environmental and seasonal allergies - stable, at baseline Refill: - montelukast (SINGULAIR) 10 MG tablet; Take 1 tablet (10 mg total) by mouth at bedtime.  Dispense: 90 tablet; Refill: 3 - azelastine  (ASTELIN) 0.1 % nasal spray; Place 1 spray into both nostrils daily. Use in each nostril as directed  Dispense: 30 mL; Refill: 5    This visit occurred during the SARS-CoV-2 public health emergency.  Safety protocols were in place, including screening questions prior to the visit, additional usage of staff PPE, and extensive cleaning of exam room while observing appropriate contact time as indicated for disinfecting solutions.

## 2020-06-14 ENCOUNTER — Encounter: Payer: Self-pay | Admitting: Family Medicine

## 2020-06-17 DIAGNOSIS — F4323 Adjustment disorder with mixed anxiety and depressed mood: Secondary | ICD-10-CM | POA: Diagnosis not present

## 2020-06-28 ENCOUNTER — Telehealth: Payer: 59 | Admitting: Family

## 2020-06-28 DIAGNOSIS — J069 Acute upper respiratory infection, unspecified: Secondary | ICD-10-CM | POA: Diagnosis not present

## 2020-06-28 MED ORDER — PREDNISONE 10 MG (21) PO TBPK: ORAL_TABLET | ORAL | 0 refills | Status: DC

## 2020-06-28 MED ORDER — BENZONATATE 100 MG PO CAPS: 100.0000 mg | ORAL_CAPSULE | Freq: Three times a day (TID) | ORAL | 0 refills | Status: DC | PRN

## 2020-06-28 MED ORDER — FLUTICASONE PROPIONATE 50 MCG/ACT NA SUSP: 2.0000 | Freq: Every day | NASAL | 6 refills | Status: DC

## 2020-06-28 NOTE — Progress Notes (Signed)
We are sorry you are not feeling well.  Here is how we plan to help!  Based on what you have shared with me, it looks like you may have a viral upper respiratory infection.  Upper respiratory infections are caused by a large number of viruses; however, rhinovirus is the most common cause.   Symptoms vary from person to person, with common symptoms including sore throat, cough, fatigue or lack of energy and feeling of general discomfort.  A low-grade fever of up to 100.4 may present, but is often uncommon.  Symptoms vary however, and are closely related to a person's age or underlying illnesses.  The most common symptoms associated with an upper respiratory infection are nasal discharge or congestion, cough, sneezing, headache and pressure in the ears and face.  These symptoms usually persist for about 3 to 10 days, but can last up to 2 weeks.  It is important to know that upper respiratory infections do not cause serious illness or complications in most cases.    Upper respiratory infections can be transmitted from person to person, with the most common method of transmission being a person's hands.  The virus is able to live on the skin and can infect other persons for up to 2 hours after direct contact.  Also, these can be transmitted when someone coughs or sneezes; thus, it is important to cover the mouth to reduce this risk.  To keep the spread of the illness at bay, good hand hygiene is very important.  This is an infection that is most likely caused by a virus. There are no specific treatments other than to help you with the symptoms until the infection runs its course.  We are sorry you are not feeling well.  Here is how we plan to help!   For nasal congestion, you may use an oral decongestants such as Mucinex D or if you have glaucoma or high blood pressure use plain Mucinex.  Saline nasal spray or nasal drops can help and can safely be used as often as needed for congestion.  For your congestion,  I have prescribed Fluticasone nasal spray one spray in each nostril twice a day  If you do not have a history of heart disease, hypertension, diabetes or thyroid disease, prostate/bladder issues or glaucoma, you may also use Sudafed to treat nasal congestion.  It is highly recommended that you consult with a pharmacist or your primary care physician to ensure this medication is safe for you to take.     If you have a cough, you may use cough suppressants such as Delsym and Robitussin.  If you have glaucoma or high blood pressure, you can also use Coricidin HBP.   For cough I have prescribed for you A prescription cough medication called Tessalon Perles 100 mg. You may take 1-2 capsules every 8 hours as needed for cough and a prednisone dose pack.  If you have a sore or scratchy throat, use a saltwater gargle-  to  teaspoon of salt dissolved in a 4-ounce to 8-ounce glass of warm water.  Gargle the solution for approximately 15-30 seconds and then spit.  It is important not to swallow the solution.  You can also use throat lozenges/cough drops and Chloraseptic spray to help with throat pain or discomfort.  Warm or cold liquids can also be helpful in relieving throat pain.  For headache, pain or general discomfort, you can use Ibuprofen or Tylenol as directed.   Some authorities believe that zinc   sprays or the use of Echinacea may shorten the course of your symptoms.   HOME CARE . Only take medications as instructed by your medical team. . Be sure to drink plenty of fluids. Water is fine as well as fruit juices, sodas and electrolyte beverages. You may want to stay away from caffeine or alcohol. If you are nauseated, try taking small sips of liquids. How do you know if you are getting enough fluid? Your urine should be a pale yellow or almost colorless. . Get rest. . Taking a steamy shower or using a humidifier may help nasal congestion and ease sore throat pain. You can place a towel over your head  and breathe in the steam from hot water coming from a faucet. . Using a saline nasal spray works much the same way. . Cough drops, hard candies and sore throat lozenges may ease your cough. . Avoid close contacts especially the very young and the elderly . Cover your mouth if you cough or sneeze . Always remember to wash your hands.   GET HELP RIGHT AWAY IF: . You develop worsening fever. . If your symptoms do not improve within 10 days . You develop yellow or green discharge from your nose over 3 days. . You have coughing fits . You develop a severe head ache or visual changes. . You develop shortness of breath, difficulty breathing or start having chest pain . Your symptoms persist after you have completed your treatment plan  MAKE SURE YOU   Understand these instructions.  Will watch your condition.  Will get help right away if you are not doing well or get worse.  Your e-visit answers were reviewed by a board certified advanced clinical practitioner to complete your personal care plan. Depending upon the condition, your plan could have included both over the counter or prescription medications. Please review your pharmacy choice. If there is a problem, you may call our nursing hot line at and have the prescription routed to another pharmacy. Your safety is important to us. If you have drug allergies check your prescription carefully.   You can use MyChart to ask questions about today's visit, request a non-urgent call back, or ask for a work or school excuse for 24 hours related to this e-Visit. If it has been greater than 24 hours you will need to follow up with your provider, or enter a new e-Visit to address those concerns. You will get an e-mail in the next two days asking about your experience.  I hope that your e-visit has been valuable and will speed your recovery. Thank you for using e-visits.    Approximately 5 minutes was spent documenting and reviewing patient's chart.     

## 2020-07-01 ENCOUNTER — Other Ambulatory Visit: Payer: Self-pay | Admitting: Family Medicine

## 2020-07-01 DIAGNOSIS — J069 Acute upper respiratory infection, unspecified: Secondary | ICD-10-CM

## 2020-07-04 ENCOUNTER — Other Ambulatory Visit: Payer: Self-pay | Admitting: Family Medicine

## 2020-07-04 DIAGNOSIS — J069 Acute upper respiratory infection, unspecified: Secondary | ICD-10-CM

## 2020-07-06 ENCOUNTER — Other Ambulatory Visit: Payer: Self-pay | Admitting: Family Medicine

## 2020-07-06 DIAGNOSIS — J069 Acute upper respiratory infection, unspecified: Secondary | ICD-10-CM

## 2020-07-08 DIAGNOSIS — F4323 Adjustment disorder with mixed anxiety and depressed mood: Secondary | ICD-10-CM | POA: Diagnosis not present

## 2020-07-10 ENCOUNTER — Other Ambulatory Visit: Payer: Self-pay | Admitting: Family Medicine

## 2020-07-10 DIAGNOSIS — J069 Acute upper respiratory infection, unspecified: Secondary | ICD-10-CM

## 2020-07-12 ENCOUNTER — Other Ambulatory Visit: Payer: Self-pay | Admitting: Family Medicine

## 2020-07-12 DIAGNOSIS — J069 Acute upper respiratory infection, unspecified: Secondary | ICD-10-CM

## 2020-07-21 MED FILL — SERTRALINE HCL 100 MG TABS: 100 | 90 days supply | Qty: 90 | Fill #0

## 2020-07-22 DIAGNOSIS — F4323 Adjustment disorder with mixed anxiety and depressed mood: Secondary | ICD-10-CM | POA: Diagnosis not present

## 2020-07-26 MED FILL — AZELASTINE HCL 137 MCG SPRY: 0.1 | 90 days supply | Qty: 30 | Fill #0

## 2020-08-07 MED FILL — MONTELUKAST SOD 10 MG TAB: 10 | 90 days supply | Qty: 90 | Fill #1

## 2020-08-07 MED FILL — LEVOCETIRIZINE 5 MG TABLET: 5 | 90 days supply | Qty: 90 | Fill #1

## 2020-08-31 ENCOUNTER — Other Ambulatory Visit (HOSPITAL_BASED_OUTPATIENT_CLINIC_OR_DEPARTMENT_OTHER): Payer: Self-pay

## 2020-09-14 ENCOUNTER — Other Ambulatory Visit (HOSPITAL_COMMUNITY): Payer: Self-pay

## 2020-10-13 ENCOUNTER — Ambulatory Visit: Payer: 59 | Admitting: Family

## 2020-10-13 ENCOUNTER — Other Ambulatory Visit: Payer: Self-pay

## 2020-10-13 VITALS — BP 137/88 | HR 83 | Temp 98.6°F | Resp 16 | Ht 63.0 in

## 2020-10-13 DIAGNOSIS — R350 Frequency of micturition: Secondary | ICD-10-CM | POA: Insufficient documentation

## 2020-10-13 LAB — POC URINALSYSI DIPSTICK (AUTOMATED)
Bilirubin, UA: NEGATIVE
Glucose, UA: NEGATIVE
Ketones, UA: NEGATIVE
Leukocytes, UA: NEGATIVE
Nitrite, UA: NEGATIVE
Protein, UA: NEGATIVE
Spec Grav, UA: 1.01 (ref 1.010–1.025)
Urobilinogen, UA: NEGATIVE E.U./dL — AB
pH, UA: 6 (ref 5.0–8.0)

## 2020-10-13 MED ORDER — CEPHALEXIN 500 MG PO CAPS: 500.0000 mg | ORAL_CAPSULE | Freq: Three times a day (TID) | ORAL | 0 refills | Status: AC

## 2020-10-13 NOTE — Assessment & Plan Note (Addendum)
New. She has trace blood in her urine, but otherwise dip is unremarkable. She just finished her menstrual cycle which may explain microscopic hematuria.  No clinical sign of ureteral obstruction of pyelonephritis.  She does not have a distended bladder on exam.  Differential includes UTI or possibly dysuria following a recently passed kidney stone. Will send urine for culture and plan empiric rx with keflex 500mg  tid pending review of urine culture.  In the meantime, she is advised to try AZO for dysuria symptoms and call if she develops flank pain, fever, worsening symptoms. Pt verbalizes understanding.

## 2020-10-13 NOTE — Progress Notes (Signed)
Subjective:   By signing my name below, I, Shehryar Baig, attest that this documentation has been prepared under the direction and in the presence of Debbrah Alar NP. 10/13/2020     Patient ID: Lori Fisher, female    DOB: 29-Mar-1976, 45 y.o.   MRN: [id number]  No chief complaint on file.   HPI   Patient is in today for a office visit. She is complaining about urinary issues. Last weekend she was urinating less than her baseline rate. She had a friend who is a sonographer do a quick [place] of her kidneys and she reports that a 9 mm stone was noted "unofficially" in the middle of the right kidney. This morning she woke up with a urgent sense to urinate but felt she could not completely empty her bladder. She notes there was some blood in her urine but she has just finished her menstrual cycle. She denies having any right sided flank pain. Denies fever.    Past Medical History:  Diagnosis Date  . Allergy   . Anxiety   . Depression   . History of kidney stones   . Hyperlipidemia   . Tennis elbow     Past Surgical History:  Procedure Laterality Date  . EXTRACORPOREAL SHOCK WAVE LITHOTRIPSY Left 04/16/2018   Procedure: EXTRACORPOREAL SHOCK WAVE LITHOTRIPSY (ESWL);  Surgeon: Ardis Hughs, MD;  Location: [facility];  Service: Urology;  Laterality: Left;  . KIDNEY STONE SURGERY    . LITHOTRIPSY      Family History  Problem Relation Age of Onset  . Colon cancer Father   . Colon polyps Father   . Diabetes Sister   . Hyperlipidemia Mother   . Esophageal cancer Neg Hx   . Rectal cancer Neg Hx   . Prostate cancer Neg Hx     Social History   Socioeconomic History  . Marital status: Married    Spouse name: Not on file  . Number of children: Not on file  . Years of education: Not on file  . Highest education level: Not on file  Occupational History  . Occupation: vascular sonography  Tobacco Use  . Smoking status: Never Smoker  . Smokeless tobacco: Never Used  Vaping Use   . Vaping Use: Never used  Substance and Sexual Activity  . Alcohol use: Yes    Alcohol/week: 1.0 standard drink    Types: 1 Glasses of wine per week  . Drug use: No  . Sexual activity: Yes  Other Topics Concern  . Not on file  Social History Narrative  . Not on file   Social Determinants of Health   Financial Resource Strain: Not on file  Food Insecurity: Not on file  Transportation Needs: Not on file  Physical Activity: Not on file  Stress: Not on file  Social Connections: Not on file  Intimate Partner Violence: Not on file    Outpatient Medications Prior to Visit  Medication Sig Dispense Refill  . azelastine (ASTELIN) 0.1 % nasal spray PLACE 1 SPRAY INTO BOTH NOSTRILS DAILY AS DIRECTED 30 mL 5  . benzonatate (TESSALON PERLES) 100 MG capsule Take 1 capsule (100 mg total) by mouth 3 (three) times daily as needed. 20 capsule 0  . Cholecalciferol (VITAMIN D3) 125 MCG (5000 UT) TABS Take 1 tablet by mouth.    . fluticasone (FLONASE) 50 MCG/ACT nasal spray Place 2 sprays into both nostrils daily. 16 g 6  . ibuprofen (ADVIL) 800 MG tablet Take 1 tablet (800 mg  total) by mouth every 8 (eight) hours as needed. 30 tablet 0  . levocetirizine (XYZAL) 5 MG tablet TAKE 1 TABLET BY MOUTH EVERY EVENING. 90 tablet 3  . MELATONIN GUMMIES PO Take 5 mg by mouth.    . montelukast (SINGULAIR) 10 MG tablet Take 1 tablet (10 mg total) by mouth at bedtime. 90 tablet 3  . Multiple Vitamin (MULTIVITAMINS PO) Take 1 tablet by mouth daily.    . predniSONE (STERAPRED UNI-PAK 21 TAB) 10 MG (21) TBPK tablet Use as directed 21 tablet 0  . Probiotic Product (PROBIOTIC-10 PO) Take by mouth.    . sertraline (ZOLOFT) 100 MG tablet TAKE 1 TABLET (100 MG TOTAL) BY MOUTH DAILY. 90 tablet 3   No facility-administered medications prior to visit.    Allergies  Allergen Reactions  . Lactose Intolerance (Gi)     Review of Systems  Genitourinary: Positive for hematuria and urgency. Negative for flank pain.        Objective:    Physical Exam Constitutional:      Appearance: Normal appearance.  HENT:     Head: Normocephalic and atraumatic.     Right Ear: External ear normal.     Left Ear: External ear normal.  Eyes:     Extraocular Movements: Extraocular movements intact.     Pupils: Pupils are equal, round, and reactive to light.  Cardiovascular:     Rate and Rhythm: Normal rate and regular rhythm.     Pulses: Normal pulses.     Heart sounds: Normal heart sounds. No murmur heard. No friction rub. No gallop.   Pulmonary:     Effort: Pulmonary effort is normal. No respiratory distress.     Breath sounds: Normal breath sounds. No stridor. No wheezing, rhonchi or rales.  Abdominal:     General: Bowel sounds are normal. There is no distension.     Palpations: Abdomen is soft. There is no mass.     Tenderness: There is no abdominal tenderness. There is no right CVA tenderness, left CVA tenderness, guarding or rebound.     Hernia: No hernia is present.  Skin:    General: Skin is warm and dry.  Neurological:     Mental Status: She is alert and oriented to person, place, and time.  Psychiatric:        Behavior: Behavior normal.     There were no vitals taken for this visit. Wt Readings from Last 3 Encounters:  06/01/20 190 lb (86.2 kg)  10/14/19 180 lb (81.6 kg)  08/10/19 187 lb (84.8 kg)    Diabetic Foot Exam - Simple   No data filed    Lab Results  Component Value Date   WBC 8.8 06/01/2020   HGB 13.4 06/01/2020   HCT 39.5 06/01/2020   PLT 246.0 06/01/2020   GLUCOSE 88 06/01/2020   CHOL 272 (H) 06/01/2020   TRIG 260.0 (H) 06/01/2020   HDL 46.90 06/01/2020   LDLDIRECT 199.0 06/01/2020   LDLCALC 172 (H) 05/22/2018   ALT 50 (H) 06/01/2020   AST 88 (H) 06/01/2020   NA 139 06/01/2020   K 4.6 06/01/2020   CL 101 06/01/2020   CREATININE 0.54 06/01/2020   BUN 10 06/01/2020   CO2 31 06/01/2020   TSH 1.720 05/22/2018   HGBA1C 5.0 05/22/2018    Lab Results   Component Value Date   TSH 1.720 05/22/2018   Lab Results  Component Value Date   WBC 8.8 06/01/2020   HGB 13.4 06/01/2020  HCT 39.5 06/01/2020   MCV 86.5 06/01/2020   PLT 246.0 06/01/2020   Lab Results  Component Value Date   NA 139 06/01/2020   K 4.6 06/01/2020   CO2 31 06/01/2020   GLUCOSE 88 06/01/2020   BUN 10 06/01/2020   CREATININE 0.54 06/01/2020   BILITOT 0.4 05/22/2018   ALKPHOS 137 (H) 05/22/2018   AST 88 (H) 06/01/2020   ALT 50 (H) 06/01/2020   PROT 6.7 05/22/2018   ALBUMIN 4.6 05/22/2018   CALCIUM 9.4 06/01/2020   GFR 111.28 06/01/2020   Lab Results  Component Value Date   CHOL 272 (H) 06/01/2020   Lab Results  Component Value Date   HDL 46.90 06/01/2020   Lab Results  Component Value Date   LDLCALC 172 (H) 05/22/2018   Lab Results  Component Value Date   TRIG 260.0 (H) 06/01/2020   Lab Results  Component Value Date   CHOLHDL 6 06/01/2020   Lab Results  Component Value Date   HGBA1C 5.0 05/22/2018       Assessment & Plan:   Problem List Items Addressed This Visit   None      No orders of the defined types were placed in this encounter.   I, Debbrah Alar NP, personally preformed the services described in this documentation.  All medical record entries made by the scribe were at my direction and in my presence.  I have reviewed the chart and discharge instructions (if applicable) and agree that the record reflects my personal performance and is accurate and complete. 10/13/2020  I,Shehryar Baig,acting as a Education administrator for Nance Pear, NP.,have documented all relevant documentation on the behalf of Nance Pear, NP,as directed by  Nance Pear, NP while in the presence of Nance Pear, NP.    Shehryar [employer]

## 2020-10-13 NOTE — Patient Instructions (Signed)
Please begin Keflex 500mg  3x daily for possible UTI. Call if new/worsening symptoms, fever or if symptoms are not improved in 2-3 days.

## 2020-10-14 LAB — URINE CULTURE
MICRO NUMBER:: 11859902
SPECIMEN QUALITY:: ADEQUATE

## 2020-10-27 IMAGING — CR DG ABDOMEN 1V
1 series · 1 of 1 positions shown · non-contrast
Comparison: Radiograph April 06, 2018.

CLINICAL DATA: Left renal calculus.

EXAM:
ABDOMEN - 1 VIEW

[t abdomen supine]
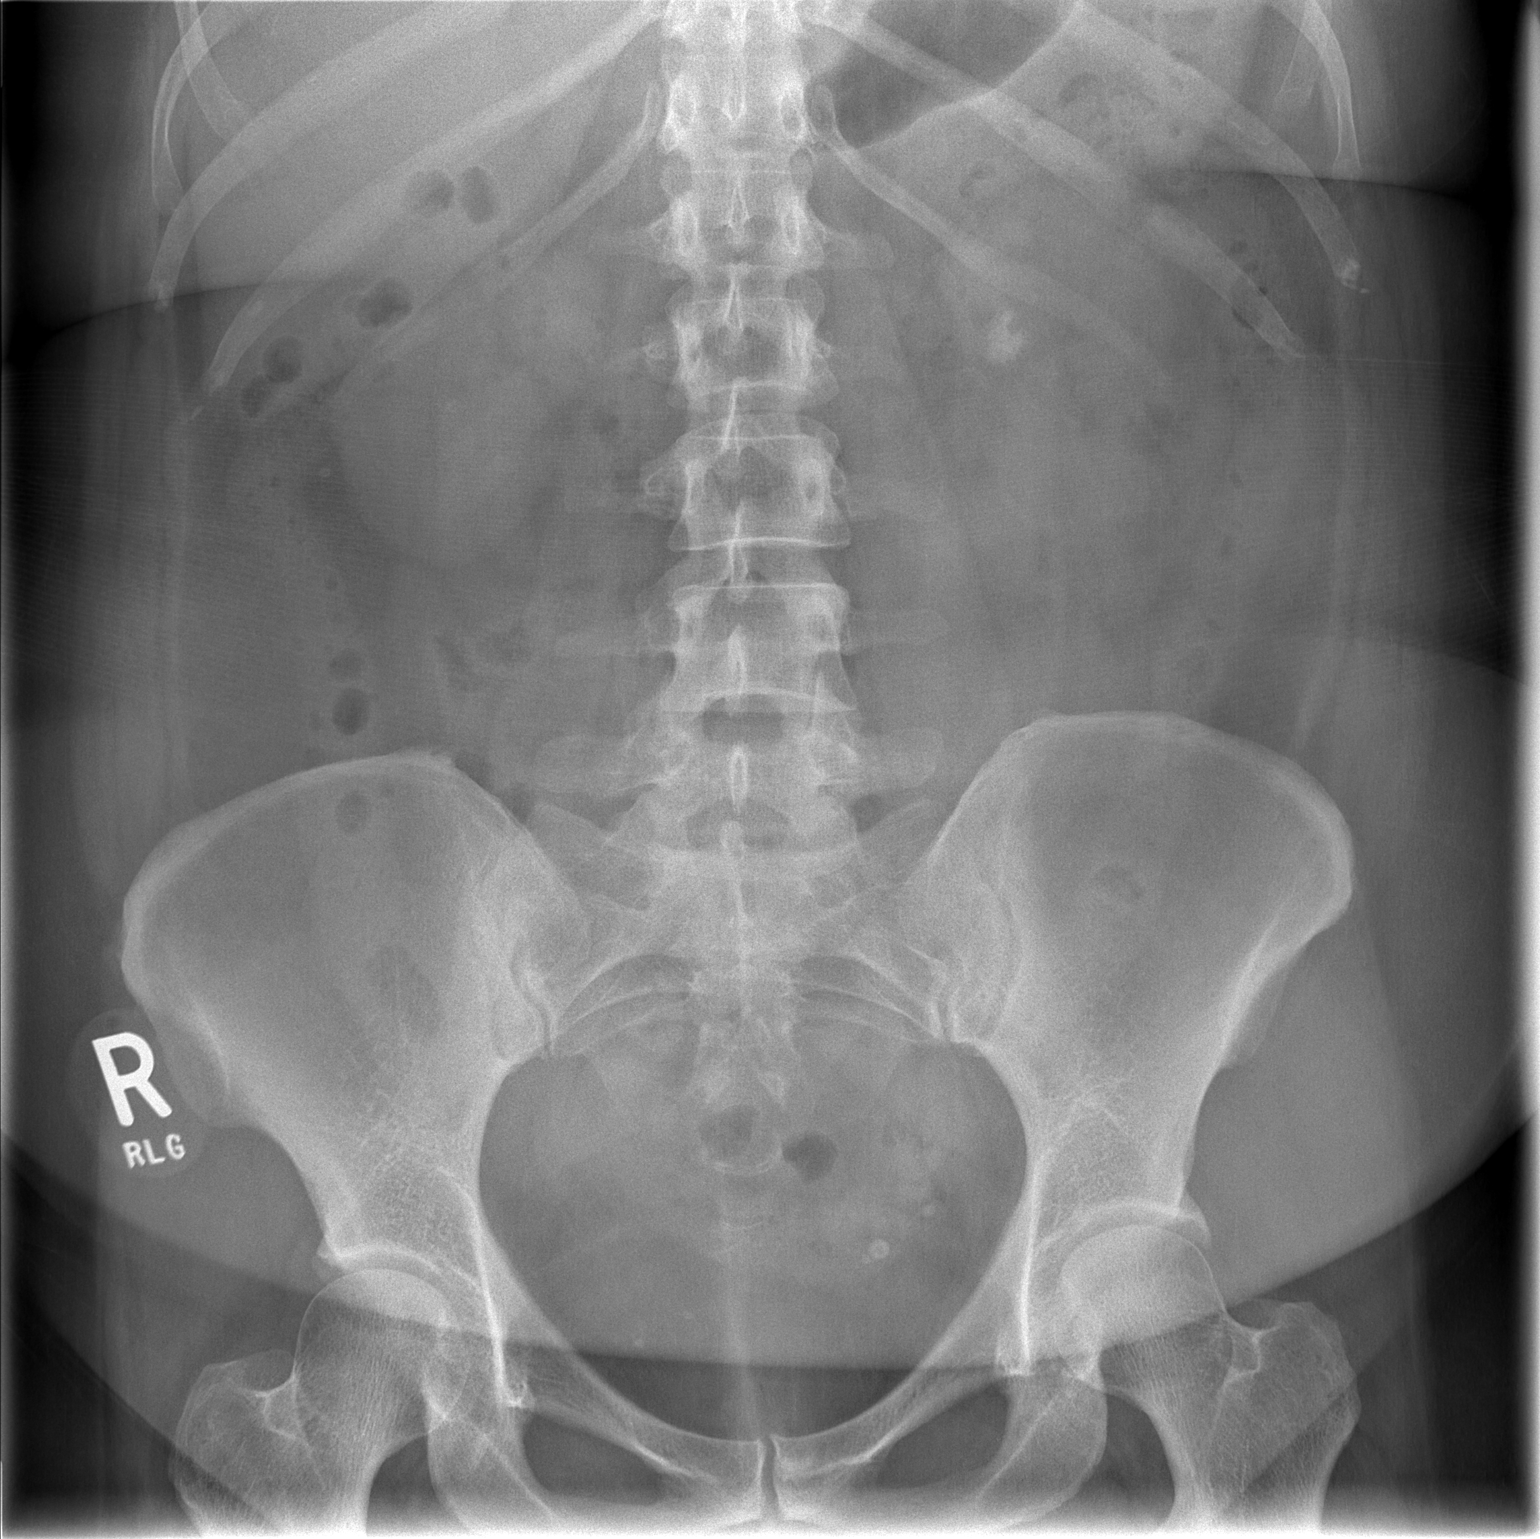

[1 of 1 positions shown; findings below may reference images not displayed]

FINDINGS: The bowel gas pattern is normal. Stable large left renal calculus is
noted. Probable small right renal calculus is noted. Phleboliths are
noted in the pelvis..
IMPRESSION: Large left renal calculus is again noted. Probable small right renal
calculus is noted.

## 2020-11-01 ENCOUNTER — Other Ambulatory Visit (HOSPITAL_COMMUNITY): Payer: Self-pay

## 2020-11-01 MED FILL — Sertraline HCl Tab 100 MG: ORAL | 90 days supply | Qty: 90 | Fill #0 | Status: AC

## 2020-11-22 ENCOUNTER — Other Ambulatory Visit (HOSPITAL_COMMUNITY): Payer: Self-pay

## 2021-01-16 ENCOUNTER — Other Ambulatory Visit (HOSPITAL_COMMUNITY): Payer: Self-pay

## 2021-01-16 MED FILL — Sertraline HCl Tab 100 MG: ORAL | 90 days supply | Qty: 90 | Fill #1 | Status: AC

## 2021-01-16 MED FILL — Levocetirizine Dihydrochloride Tab 5 MG: ORAL | 90 days supply | Qty: 90 | Fill #0 | Status: AC

## 2021-01-16 MED FILL — Azelastine HCl Nasal Spray 0.1% (137 MCG/SPRAY): NASAL | 90 days supply | Qty: 30 | Fill #0 | Status: AC

## 2021-04-30 ENCOUNTER — Other Ambulatory Visit (HOSPITAL_COMMUNITY): Payer: Self-pay

## 2022-01-27 IMAGING — MR MR ELBOW*R* W/O CM
5 series · 40 of 40 positions shown · non-contrast
Comparison: None.

CLINICAL DATA: Elbow pain, chronic

EXAM:
MRI OF THE RIGHT ELBOW WITHOUT CONTRAST
TECHNIQUE: Multiplanar, multisequence MR imaging of the elbow was performed. No
intravenous contrast was administered.

[Series 4: T1 · axial · 3.0mm · 0.51mm/px · z∈[+16,+105]mm · 9 of 24 slices shown (1 of 2)]
[im 1/24]
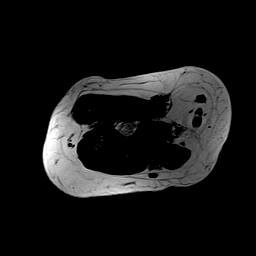
[im 3/24]
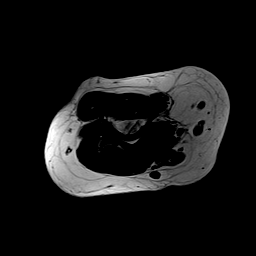
[im 6/24]
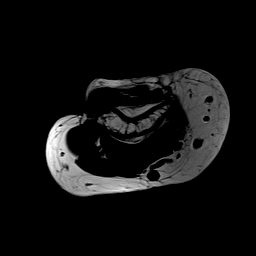
[im 9/24]
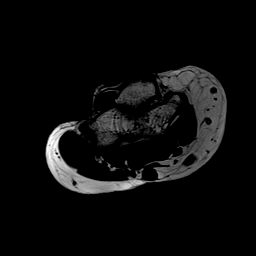
[im 12/24]
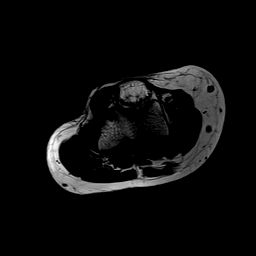
[im 15/24]
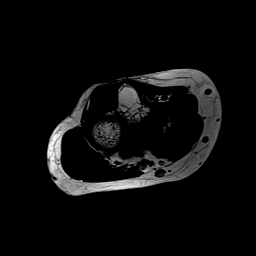
[im 18/24]
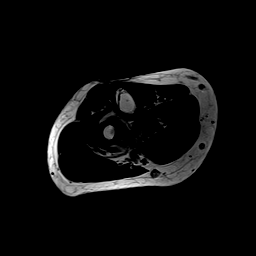
[im 21/24]
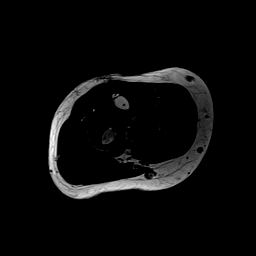
[im 24/24]
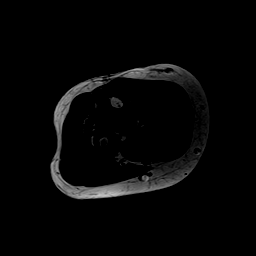

[Series 5: T2 fat-sat · axial · 3.0mm · 0.51mm/px · z∈[+16,+105]mm · 9 of 24 slices shown (1 of 2)]
[im 1/24]
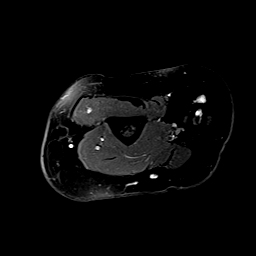
[im 3/24]
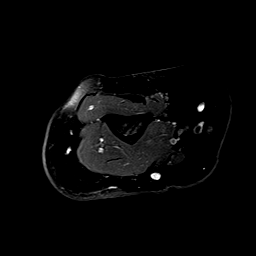
[im 6/24]
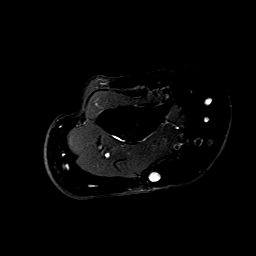
[im 9/24]
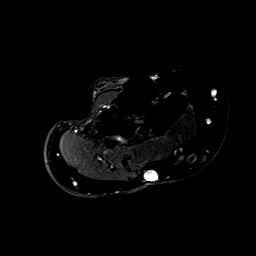
[im 12/24]
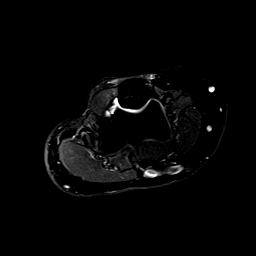
[im 15/24]
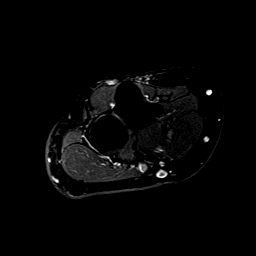
[im 18/24]
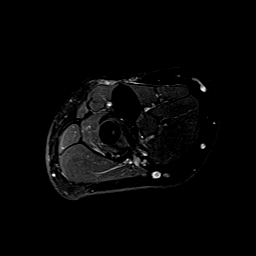
[im 21/24]
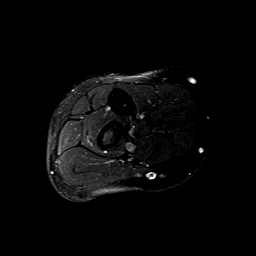
[im 24/24]
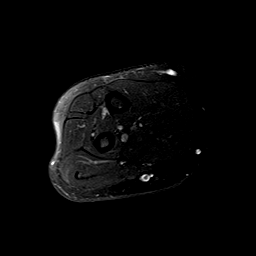

[Series 6: T2 fat-sat · coronal · 3.0mm · 0.51mm/px · 7 of 18 slices shown (2 of 2)]
[im 1/18]
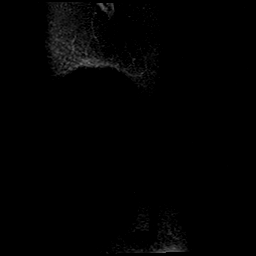
[im 3/18]
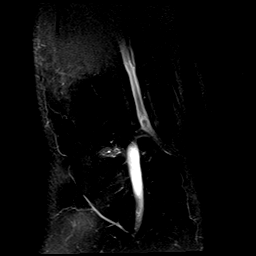
[im 6/18]
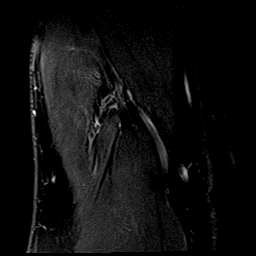
[im 9/18]
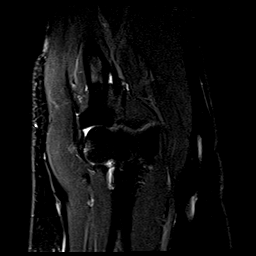
[im 12/18]
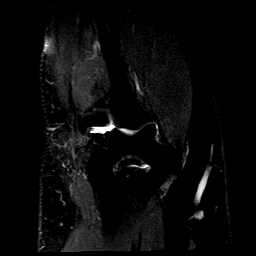
[im 15/18]
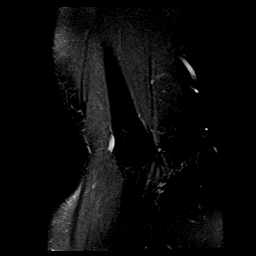
[im 18/18]
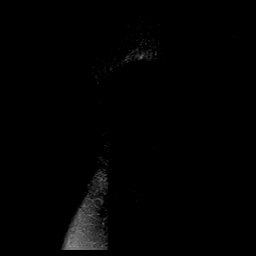

[Series 7: T1 · coronal · 3.0mm · 0.51mm/px · 7 of 18 slices shown (2 of 2)]
[im 1/18]
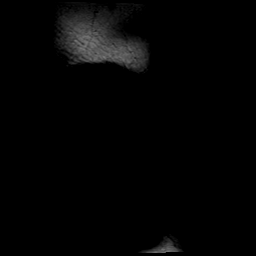
[im 3/18]
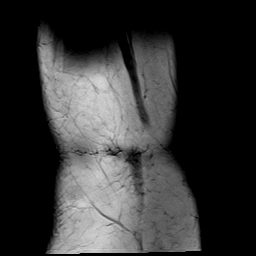
[im 6/18]
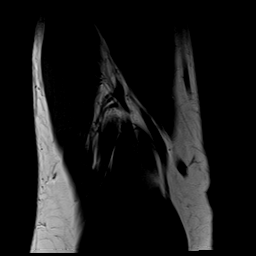
[im 9/18]
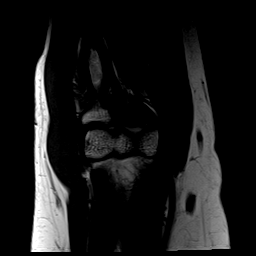
[im 12/18]
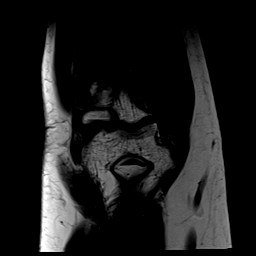
[im 15/18]
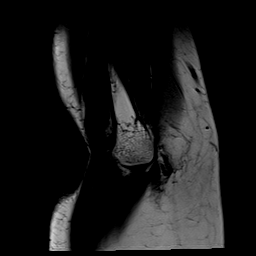
[im 18/18]
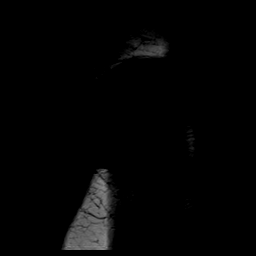

[Series 8: PD fat-sat · sagittal · 3.0mm · 0.51mm/px · 8 of 20 slices shown]
[im 1/20]
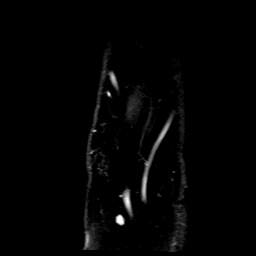
[im 3/20]
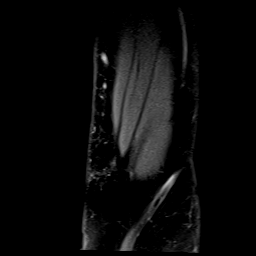
[im 6/20]
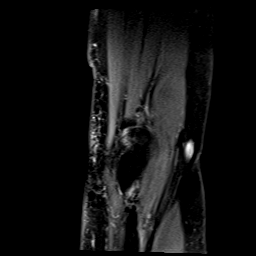
[im 9/20]
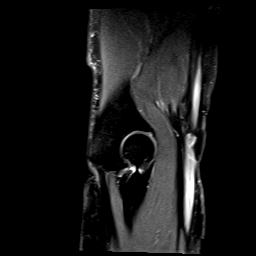
[im 11/20]
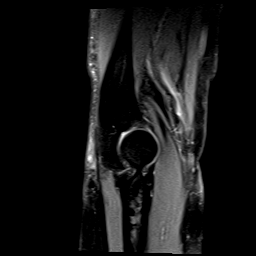
[im 14/20]
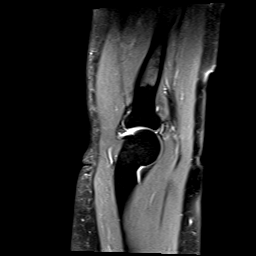
[im 17/20]
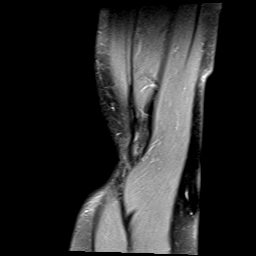
[im 20/20]
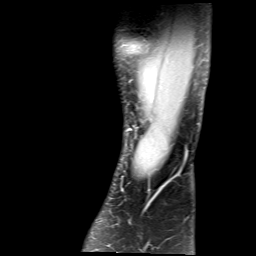

[40 of 40 positions shown; findings below may reference images not displayed]

FINDINGS: TENDONS

Common forearm flexor origin: Intact with normal signal.

Common forearm extensor origin: There is a partial interstitial tear
seen at the common extensor tendon origin measuring approximately 5
mm in length. Increased intrasubstance signal seen throughout the
common extensor insertion site.

Biceps: Intact.

Brachialis: Intact

Triceps: Intact with normal signal.

LIGAMENTS

Medial stabilizers: The ulnar collateral ligament is intact.

Lateral stabilizers: The lateral ulnar and radial collateral
ligaments appear intact.

Cartilage: Preserved.  No focal chondral defect demonstrated.

Joint: There is a trace elbow joint effusion. No loose bodies are
seen.

Cubital tunnel: Unremarkable.  The ulnar nerve appears normal.

Bones: There is mildly increased marrow signal seen at the insertion
site of the common extensor tendon. No osseous fracture is seen.

Other: None.
IMPRESSION: Lateral epicondylitis with a partial interstitial tear and
tendinosis of the common extensor tendon.

Trace elbow joint effusion.

## 2023-07-29 ENCOUNTER — Other Ambulatory Visit: Payer: Self-pay

## 2023-07-29 ENCOUNTER — Emergency Department (HOSPITAL_COMMUNITY): Payer: PRIVATE HEALTH INSURANCE

## 2023-07-29 ENCOUNTER — Emergency Department (HOSPITAL_COMMUNITY)
Admission: EM | Admit: 2023-07-29 | Discharge: 2023-07-29 | Disposition: A | Discharge status: Home / Self Care | Payer: PRIVATE HEALTH INSURANCE | Attending: Emergency Medicine | Admitting: Emergency Medicine

## 2023-07-29 ENCOUNTER — Encounter (HOSPITAL_COMMUNITY): Payer: Self-pay | Admitting: *Deleted

## 2023-07-29 DIAGNOSIS — R109 Unspecified abdominal pain: Secondary | ICD-10-CM | POA: Diagnosis present

## 2023-07-29 DIAGNOSIS — N200 Calculus of kidney: Secondary | ICD-10-CM

## 2023-07-29 DIAGNOSIS — N132 Hydronephrosis with renal and ureteral calculous obstruction: Secondary | ICD-10-CM | POA: Insufficient documentation

## 2023-07-29 LAB — CBC WITH DIFFERENTIAL/PLATELET
Abs Immature Granulocytes: 0.01 10*3/uL (ref 0.00–0.07)
Basophils Absolute: 0 10*3/uL (ref 0.0–0.1)
Basophils Relative: 0 %
Eosinophils Absolute: 0.1 10*3/uL (ref 0.0–0.5)
Eosinophils Relative: 1 %
HCT: 41.2 % (ref 36.0–46.0)
Hemoglobin: 14.1 g/dL (ref 12.0–15.0)
Immature Granulocytes: 0 %
Lymphocytes Relative: 25 %
Lymphs Abs: 2 10*3/uL (ref 0.7–4.0)
MCH: 30.3 pg (ref 26.0–34.0)
MCHC: 34.2 g/dL (ref 30.0–36.0)
MCV: 88.6 fL (ref 80.0–100.0)
Monocytes Absolute: 0.4 10*3/uL (ref 0.1–1.0)
Monocytes Relative: 4 %
Neutro Abs: 5.7 10*3/uL (ref 1.7–7.7)
Neutrophils Relative %: 70 %
Platelets: 198 10*3/uL (ref 150–400)
RBC: 4.65 MIL/uL (ref 3.87–5.11)
RDW: 13.1 % (ref 11.5–15.5)
WBC: 8.2 10*3/uL (ref 4.0–10.5)
nRBC: 0 % (ref 0.0–0.2)

## 2023-07-29 LAB — URINALYSIS, ROUTINE W REFLEX MICROSCOPIC
Bilirubin Urine: NEGATIVE
Glucose, UA: NEGATIVE mg/dL
Ketones, ur: NEGATIVE mg/dL
Leukocytes,Ua: NEGATIVE
Nitrite: NEGATIVE
Protein, ur: NEGATIVE mg/dL
Specific Gravity, Urine: 1.015 (ref 1.005–1.030)
pH: 5 (ref 5.0–8.0)

## 2023-07-29 LAB — COMPREHENSIVE METABOLIC PANEL
ALT: 17 U/L (ref 0–44)
AST: 15 U/L (ref 15–41)
Albumin: 3.9 g/dL (ref 3.5–5.0)
Alkaline Phosphatase: 102 U/L (ref 38–126)
Anion gap: 12 (ref 5–15)
BUN: 9 mg/dL (ref 6–20)
CO2: 24 mmol/L (ref 22–32)
Calcium: 8.7 mg/dL — ABNORMAL LOW (ref 8.9–10.3)
Chloride: 103 mmol/L (ref 98–111)
Creatinine, Ser: 0.42 mg/dL — ABNORMAL LOW (ref 0.44–1.00)
GFR, Estimated: >60 mL/min (ref >60)
Glucose, Bld: 92 mg/dL (ref 70–99)
Potassium: 3.4 mmol/L — ABNORMAL LOW (ref 3.5–5.1)
Sodium: 139 mmol/L (ref 135–145)
Total Bilirubin: 0.6 mg/dL (ref 0.0–1.2)
Total Protein: 7 g/dL (ref 6.5–8.1)

## 2023-07-29 LAB — HCG, QUANTITATIVE, PREGNANCY: hCG, Beta Chain, Quant, S: <1 m[IU]/mL (ref <5)

## 2023-07-29 LAB — LIPASE, BLOOD: Lipase: 32 U/L (ref 11–51)

## 2023-07-29 MED ORDER — ONDANSETRON 8 MG PO TBDP
8.0000 mg | ORAL_TABLET | Freq: Three times a day (TID) | ORAL | 0 refills | Status: AC | PRN
Start: 2023-07-29 — End: ?

## 2023-07-29 MED ORDER — OXYCODONE-ACETAMINOPHEN 5-325 MG PO TABS
1.0000 | ORAL_TABLET | Freq: Three times a day (TID) | ORAL | 0 refills | Status: DC | PRN
Start: 2023-07-29 — End: 2023-08-15

## 2023-07-29 MED ORDER — KETOROLAC TROMETHAMINE 15 MG/ML IJ SOLN
15.0000 mg | Freq: Once | INTRAMUSCULAR | Status: AC
  Administered 2023-07-29: 15 mg via INTRAVENOUS
  Filled 2023-07-29: qty 1

## 2023-07-29 MED ORDER — KETOROLAC TROMETHAMINE 10 MG PO TABS
10.0000 mg | ORAL_TABLET | Freq: Four times a day (QID) | ORAL | 0 refills | Status: AC | PRN
Start: 2023-07-29 — End: ?

## 2023-07-29 MED ORDER — FENTANYL CITRATE PF 50 MCG/ML IJ SOSY
75.0000 ug | PREFILLED_SYRINGE | INTRAMUSCULAR | Status: DC | PRN
  Administered 2023-07-29: 75 ug via INTRAVENOUS
  Filled 2023-07-29: qty 2

## 2023-07-29 NOTE — Discharge Instructions (Signed)
The CT confirms a kidney stone.  Take the meds prescribed. Expect a call from the urologist, for outpatient management. If the pain is unbearable, you start having fevers, chills, and are unable to keep any meds down - then return to the ER.

## 2023-07-29 NOTE — ED Provider Notes (Signed)
[name] Provider Note   CSN: [id number] Arrival date & time: 07/29/23  4098     History  Chief Complaint  Patient presents with   Flank Pain    Lori Fisher is a 48 y.o. female.  HPI    48 year old female comes in with chief complaint of right-sided flank pain.  Patient is been having intermittent abdominal pain for the last several months.  She saw GI doctor recently and had CT scan done on Friday.  The same day she started having worsening right-sided flank pain.  Subsequently the CT scan found a 2 cm renal stone with hydronephrosis.  Her symptoms have worsened since then.  She went to urgent care, and was found to have blood in the urine.  They gave her urology follow-up.  Over the last 2 days, patient has been having increasing intensity of the pain and frequency along with blood in the urine that started yesterday.  She has no burning with urination.  She has had previous kidney stones that required lithotripsy.  Home Medications Prior to Admission medications   Medication Sig Start Date End Date Taking? Authorizing Provider  atorvastatin (LIPITOR) 10 MG tablet Take 10 mg by mouth daily. 10/30/21  Yes [provider]  azelastine (ASTELIN) 0.1 % nasal spray PLACE 1 SPRAY INTO BOTH NOSTRILS DAILY AS DIRECTED Patient taking differently: Place 1 spray into both nostrils daily. 06/01/20 07/29/23 Yes Cirigliano, Jearld Lesch, DO  Cholecalciferol (VITAMIN D3) 125 MCG (5000 UT) TABS Take 1 tablet by mouth daily.   Yes [provider]  fluticasone (FLONASE) 50 MCG/ACT nasal spray Place 2 sprays into both nostrils daily. 06/28/20  Yes Hawks, Christy A, FNP  ibuprofen (ADVIL) 800 MG tablet Take 1 tablet (800 mg total) by mouth every 8 (eight) hours as needed. Patient taking differently: Take 800 mg by mouth every 8 (eight) hours as needed for moderate pain (pain score 4-6). 08/07/19  Yes Hall-Potvin, [place], PA-C  ketorolac  (TORADOL) 10 MG tablet Take 1 tablet (10 mg total) by mouth every 6 (six) hours as needed. 07/29/23  Yes Derwood Kaplan, MD  levocetirizine (XYZAL) 5 MG tablet TAKE 1 TABLET BY MOUTH EVERY EVENING. Patient taking differently: Take 5 mg by mouth every evening. 05/15/20 07/29/23 Yes Cirigliano, Mary K, DO  montelukast (SINGULAIR) 10 MG tablet Take 1 tablet (10 mg total) by mouth at bedtime. 06/01/20  Yes Cirigliano, Mary K, DO  ondansetron (ZOFRAN-ODT) 8 MG disintegrating tablet Take 1 tablet (8 mg total) by mouth every 8 (eight) hours as needed for nausea. 07/29/23  Yes Derwood Kaplan, MD  oxyCODONE-acetaminophen (PERCOCET/ROXICET) 5-325 MG tablet Take 1 tablet by mouth every 8 (eight) hours as needed for severe pain (pain score 7-10). 07/29/23  Yes Bernell Sigal, MD  sertraline (ZOLOFT) 100 MG tablet TAKE 1 TABLET (100 MG TOTAL) BY MOUTH DAILY. Patient taking differently: Take 100 mg by mouth daily. 06/01/20 07/29/23 Yes Cirigliano, Mary K, DO  dicyclomine (BENTYL) 10 MG capsule Take 10 mg by mouth every 6 (six) hours as needed for spasms. Patient not taking: Reported on 07/29/2023 06/18/23   [provider]      Allergies    Lactose intolerance (gi)    Review of Systems   Review of Systems  All other systems reviewed and are negative.   Physical Exam Updated Vital Signs BP 115/74   Pulse 74   Temp 97.8 F (36.6 C) (Oral)   Resp 14   Ht  5\' 3"  (1.6 m)   Wt 83 kg   LMP  (LMP Unknown)   SpO2 98%   BMI 32.41 kg/m  Physical Exam Vitals and nursing note reviewed.  Constitutional:      Appearance: She is well-developed.  HENT:     Head: Atraumatic.  Cardiovascular:     Rate and Rhythm: Normal rate.  Pulmonary:     Effort: Pulmonary effort is normal.  Abdominal:     Palpations: Abdomen is soft.     Tenderness: There is no abdominal tenderness.  Musculoskeletal:     Cervical back: Normal range of motion and neck supple.  Skin:    General: Skin is warm and dry.   Neurological:     Mental Status: She is alert and oriented to person, place, and time.     ED Results / Procedures / Treatments   Labs (all labs ordered are listed, but only abnormal results are displayed) Labs Reviewed  URINALYSIS, ROUTINE W REFLEX MICROSCOPIC - Abnormal; Notable for the following components:      Result Value   Hgb urine dipstick MODERATE (*)    Bacteria, UA RARE (*)    All other components within normal limits  COMPREHENSIVE METABOLIC PANEL - Abnormal; Notable for the following components:   Potassium 3.4 (*)    Creatinine, Ser 0.42 (*)    Calcium 8.7 (*)    All other components within normal limits  CBC WITH DIFFERENTIAL/PLATELET  LIPASE, BLOOD  HCG, QUANTITATIVE, PREGNANCY    EKG None  Radiology CT Renal Stone Study Result Date: 07/29/2023 CLINICAL DATA:  Abdominal/flank pain.  Concern for kidney stone. EXAM: CT ABDOMEN AND PELVIS WITHOUT CONTRAST TECHNIQUE: Multidetector CT imaging of the abdomen and pelvis was performed following the standard protocol without IV contrast. RADIATION DOSE REDUCTION: This exam was performed according to the departmental dose-optimization program which includes automated exposure control, adjustment of the mA and/or kV according to patient size and/or use of iterative reconstruction technique. COMPARISON:  None Available. FINDINGS: Evaluation of this exam is limited in the absence of intravenous contrast. Lower chest: The visualized lung bases are clear. No intra-abdominal free air or free fluid. Hepatobiliary: Fatty liver. No biliary ductal dilatation. The gallbladder is unremarkable. Pancreas: Unremarkable. No pancreatic ductal dilatation or surrounding inflammatory changes. Spleen: Normal in size without focal abnormality. Adrenals/Urinary Tract: The adrenal glands unremarkable. There is a 2 cm partially obstructing stone in the right renal pelvis with mild right hydronephrosis. There is a 4 mm nonobstructing left renal inferior  pole calculus. There is no hydronephrosis on the left. The visualized ureters and urinary bladder appear unremarkable. Stomach/Bowel: There is sigmoid diverticulosis without active inflammatory changes. There is no bowel obstruction or active inflammation. The appendix is normal. Vascular/Lymphatic: The abdominal aorta and IVC are unremarkable on this noncontrast CT. No portal venous gas. There is no adenopathy. Reproductive: The uterus is grossly unremarkable. No suspicious adnexal masses. Other: None Musculoskeletal: No acute osseous pathology. IMPRESSION: 1. A 2 cm partially obstructing stone in the right renal pelvis with mild right hydronephrosis. 2. A 4 mm nonobstructing left renal inferior pole calculus. No hydronephrosis on the left. 3. Sigmoid diverticulosis. No bowel obstruction. Normal appendix. 4. Fatty liver. Electronically Signed   By: Elgie Collard M.D.   On: 07/29/2023 14:54    Procedures Procedures    Medications Ordered in ED Medications  fentaNYL (SUBLIMAZE) injection 75 mcg (75 mcg Intravenous Given 07/29/23 1433)  ketorolac (TORADOL) 15 MG/ML injection 15 mg (has no administration  in time range)    ED Course/ Medical Decision Making/ A&P Clinical Course as of 07/29/23 1609  Tue Jul 29, 2023  1608 Urology recommends outpatient management at this time.  Patient comfortable with the plan. [AN]    Clinical Course User Index [AN] Derwood Kaplan, MD                                 Medical Decision Making Amount and/or Complexity of Data Reviewed Labs: ordered. Radiology: ordered.  Risk Prescription drug management.   This patient presents to the ED with chief complaint(s) of worsening flank pain, blood in the urine with pertinent past medical history of known nephrolithiasis and previous history of kidney stone that required lithotripsy.The complaint involves an extensive differential diagnosis and also carries with it a high risk of complications and morbidity.     The differential diagnosis includes kidney stone, ureteral colic, pyelonephritis, perinephric abscess, cystitis.  The initial plan is to get basic labs. Likely will need urology consult.   Additional history obtained: Additional history obtained from spouse Records reviewed Care Everywhere/External Records  Independent labs interpretation:  The following labs were independently interpreted: UA shows hematuria.  Creatinine is normal.  No elevated white count.  Low suspicion for infection.  Independent visualization and interpretation of imaging: - I independently visualized the following imaging with scope of interpretation limited to determining acute life threatening conditions related to emergency care: CT scan renal stone, which revealed persistent hydronephrosis on the right side.  Consultation: - Consulted or discussed management/test interpretation with external professional: Urology has been consulted.   Social Determinants of health: None  Final Clinical Impression(s) / ED Diagnoses Final diagnoses:  Nephrolithiasis    Rx / DC Orders ED Discharge Orders          Ordered    ketorolac (TORADOL) 10 MG tablet  Every 6 hours PRN        07/29/23 1605    ondansetron (ZOFRAN-ODT) 8 MG disintegrating tablet  Every 8 hours PRN        07/29/23 1608    oxyCODONE-acetaminophen (PERCOCET/ROXICET) 5-325 MG tablet  Every 8 hours PRN        07/29/23 1608              Derwood Kaplan, MD 07/29/23 1609

## 2023-07-29 NOTE — Consult Note (Signed)
Reason for Consult: Recurrent Urolithiasis, Medical Stone Disease   Referring Physician: Derwood Kaplan MD  Lori Fisher is an 48 y.o. female.   HPI:    1 - Recurrent Urolithiasis -  Pre 2025 - SWL x 2 07/2023 - Rt 2cm UPJ stone minimal hydro on, Lt lower pole 3mm renal stone on ER CT on eval intermitant Rt flank pain.   2 - Medical Stone Disease -  BMP, PTH, Urate - pending; Composition - 80% CaOx / 20% CaPO4; 24 Hr Urines - pending  PMH sig for mild obesity, NO CV disease / blood thinners, NO chest/abd surgery. PCP Irving Copas with [facility]  Today "Lori Fisher" is seen in consultation for above. No fevers, UA without significatn infecdtiuos parameters.   Past Medical History:  Diagnosis Date  . Allergy   . Anxiety   . Depression   . History of kidney stones   . Hyperlipidemia   . Tennis elbow     Past Surgical History:  Procedure Laterality Date  . EXTRACORPOREAL SHOCK WAVE LITHOTRIPSY Left 04/16/2018   Procedure: EXTRACORPOREAL SHOCK WAVE LITHOTRIPSY (ESWL);  Surgeon: Crist Fat, MD;  Location: [facility];  Service: Urology;  Laterality: Left;  . KIDNEY STONE SURGERY    . LITHOTRIPSY      Family History  Problem Relation Age of Onset  . Colon cancer Father   . Colon polyps Father   . Diabetes Sister   . Hyperlipidemia Mother   . Esophageal cancer Neg Hx   . Rectal cancer Neg Hx   . Prostate cancer Neg Hx     Social History:  reports that she has never smoked. She has never used smokeless tobacco. She reports current alcohol use of about 1.0 standard drink of alcohol per week. She reports that she does not use drugs.  Allergies:  Allergies  Allergen Reactions  . Lactose Intolerance (Gi)     Medications: I have reviewed the patient's current medications.  Results for orders placed or performed during the hospital encounter of 07/29/23 (from the past 48 hours)  CBC with Differential     Status: None   Collection Time: 07/29/23 10:55 AM  Result Value Ref Range    WBC 8.2 4.0 - 10.5 K/uL   RBC 4.65 3.87 - 5.11 MIL/uL   Hemoglobin 14.1 12.0 - 15.0 g/dL   HCT 86.5 78.4 - 69.6 %   MCV 88.6 80.0 - 100.0 fL   MCH 30.3 26.0 - 34.0 pg   MCHC 34.2 30.0 - 36.0 g/dL   RDW 29.5 28.4 - 13.2 %   Platelets 198 150 - 400 K/uL   nRBC 0.0 0.0 - 0.2 %   Neutrophils Relative % 70 %   Neutro Abs 5.7 1.7 - 7.7 K/uL   Lymphocytes Relative 25 %   Lymphs Abs 2.0 0.7 - 4.0 K/uL   Monocytes Relative 4 %   Monocytes Absolute 0.4 0.1 - 1.0 K/uL   Eosinophils Relative 1 %   Eosinophils Absolute 0.1 0.0 - 0.5 K/uL   Basophils Relative 0 %   Basophils Absolute 0.0 0.0 - 0.1 K/uL   Immature Granulocytes 0 %   Abs Immature Granulocytes 0.01 0.00 - 0.07 K/uL    Comment: Performed at [facility], [address] [place], [place] [address]  Comprehensive metabolic panel     Status: Abnormal   Collection Time: 07/29/23 10:55 AM  Result Value Ref Range   Sodium 139 135 - 145 mmol/L   Potassium 3.4 (L)  3.5 - 5.1 mmol/L   Chloride 103 98 - 111 mmol/L   CO2 24 22 - 32 mmol/L   Glucose, Bld 92 70 - 99 mg/dL    Comment: Glucose reference range applies only to samples taken after fasting for at least 8 hours.   BUN 9 6 - 20 mg/dL   Creatinine, Ser 1.61 (L) 0.44 - 1.00 mg/dL   Calcium 8.7 (L) 8.9 - 10.3 mg/dL   Total Protein 7.0 6.5 - 8.1 g/dL   Albumin 3.9 3.5 - 5.0 g/dL   AST 15 15 - 41 U/L   ALT 17 0 - 44 U/L   Alkaline Phosphatase 102 38 - 126 U/L   Total Bilirubin 0.6 0.0 - 1.2 mg/dL   GFR, Estimated >09 >60 mL/min    Comment: (NOTE) Calculated using the CKD-EPI Creatinine Equation (2021)    Anion gap 12 5 - 15    Comment: Performed at [facility], [address] [place], [place] [address]  Lipase, blood     Status: None   Collection Time: 07/29/23 10:55 AM  Result Value Ref Range   Lipase 32 11 - 51 U/L    Comment: Performed at [facility], [address], [place], [place] [address]  hCG,  quantitative, pregnancy     Status: None   Collection Time: 07/29/23 10:55 AM  Result Value Ref Range   hCG, Beta Chain, Quant, S <1 <5 mIU/mL    Comment:          GEST. AGE      CONC.  (mIU/mL)   <=1 WEEK        5 - 50     2 WEEKS       50 - 500     3 WEEKS       100 - 10,000     4 WEEKS     1,000 - 30,000     5 WEEKS     3,500 - 115,000   6-8 WEEKS     12,000 - 270,000    12 WEEKS     15,000 - 220,000        FEMALE AND NON-PREGNANT FEMALE:     LESS THAN 5 mIU/mL Performed at [facility], [address] [place], [place] [address]   Urinalysis, Routine w reflex microscopic -Urine, Clean Catch     Status: Abnormal   Collection Time: 07/29/23 10:59 AM  Result Value Ref Range   Color, Urine YELLOW YELLOW   APPearance CLEAR CLEAR   Specific Gravity, Urine 1.015 1.005 - 1.030   pH 5.0 5.0 - 8.0   Glucose, UA NEGATIVE NEGATIVE mg/dL   Hgb urine dipstick MODERATE (A) NEGATIVE   Bilirubin Urine NEGATIVE NEGATIVE   Ketones, ur NEGATIVE NEGATIVE mg/dL   Protein, ur NEGATIVE NEGATIVE mg/dL   Nitrite NEGATIVE NEGATIVE   Leukocytes,Ua NEGATIVE NEGATIVE   RBC / HPF 6-10 0 - 5 RBC/hpf   WBC, UA 0-5 0 - 5 WBC/hpf   Bacteria, UA RARE (A) NONE SEEN   Squamous Epithelial / HPF 0-5 0 - 5 /HPF   Mucus PRESENT     Comment: Performed at [facility], [address], [place], [place] [address]    CT Renal Stone Study Result Date: 07/29/2023 CLINICAL DATA:  Abdominal/flank pain.  Concern for kidney stone. EXAM: CT ABDOMEN AND PELVIS WITHOUT CONTRAST TECHNIQUE: Multidetector CT imaging of the abdomen and pelvis was performed following the standard protocol  without IV contrast. RADIATION DOSE REDUCTION: This exam was performed according to the departmental dose-optimization program which includes automated exposure control, adjustment of the mA and/or kV according to patient size and/or use of iterative reconstruction technique. COMPARISON:  None Available.  FINDINGS: Evaluation of this exam is limited in the absence of intravenous contrast. Lower chest: The visualized lung bases are clear. No intra-abdominal free air or free fluid. Hepatobiliary: Fatty liver. No biliary ductal dilatation. The gallbladder is unremarkable. Pancreas: Unremarkable. No pancreatic ductal dilatation or surrounding inflammatory changes. Spleen: Normal in size without focal abnormality. Adrenals/Urinary Tract: The adrenal glands unremarkable. There is a 2 cm partially obstructing stone in the right renal pelvis with mild right hydronephrosis. There is a 4 mm nonobstructing left renal inferior pole calculus. There is no hydronephrosis on the left. The visualized ureters and urinary bladder appear unremarkable. Stomach/Bowel: There is sigmoid diverticulosis without active inflammatory changes. There is no bowel obstruction or active inflammation. The appendix is normal. Vascular/Lymphatic: The abdominal aorta and IVC are unremarkable on this noncontrast CT. No portal venous gas. There is no adenopathy. Reproductive: The uterus is grossly unremarkable. No suspicious adnexal masses. Other: None Musculoskeletal: No acute osseous pathology. IMPRESSION: 1. A 2 cm partially obstructing stone in the right renal pelvis with mild right hydronephrosis. 2. A 4 mm nonobstructing left renal inferior pole calculus. No hydronephrosis on the left. 3. Sigmoid diverticulosis. No bowel obstruction. Normal appendix. 4. Fatty liver. Electronically Signed   By: Elgie Collard M.D.   On: 07/29/2023 14:54    Review of Systems  Constitutional:  Negative for chills and fever.  Genitourinary:  Positive for flank pain.  All other systems reviewed and are negative.  Blood pressure 130/84, pulse 69, temperature 99.2 F (37.3 C), temperature source Oral, resp. rate 18, height 5\' 3"  (1.6 m), weight 83 kg, SpO2 99%. Physical Exam Vitals reviewed.  Constitutional:      Comments: Pleasant, NAD, husband at bedside as  well.   HENT:     Head: Normocephalic.  Eyes:     Pupils: Pupils are equal, round, and reactive to light.  Cardiovascular:     Rate and Rhythm: Normal rate.  Pulmonary:     Effort: Pulmonary effort is normal.  Abdominal:     General: Abdomen is flat.     Comments: Mild truncal obesity.   Musculoskeletal:     Cervical back: Normal range of motion.     Comments: Minimal CVAT at present.   Skin:    General: Skin is warm.  Neurological:     Mental Status: She is alert.  Psychiatric:        Mood and Affect: Mood normal.    Assessment/Plan:  Discussed stone managmetn options indlucine stenting today and elective bilateral ureteroscopy to stone free v just elective ureteroscopy. Her colic is controllable at present and she wants to go home. Rec ketorolac mild pain, oxy for breakthrough, we will request bilateral ureteroscopy in elective setting. RIsks, benefits, alternatives, expected peri-op course discussed.   Loletta Parish. 07/29/2023, 4:23 PM

## 2023-07-29 NOTE — ED Triage Notes (Addendum)
BIB family from home for R flank pain, onset Friday. Had CT with [facility] on Friday which showed a 2cm stone. Referral to GU given, but not until this coming Friday. Pain worsened and went to UC on Sunday, nothing prescribed, encouraged tylenol. No relief with tylenol and came to ED today. Describes pain and hematuria worse. Endorses weaker stream and decreased u.o. Denies vomiting or fever. Last tylenol yesterday. H/o kidney stone with lithotripsy.

## 2023-07-29 NOTE — ED Notes (Signed)
 Patient transported to CT

## 2023-08-04 ENCOUNTER — Other Ambulatory Visit: Payer: Self-pay | Admitting: Urology

## 2023-08-06 NOTE — Progress Notes (Signed)
 Sent message, via epic in basket, requesting orders in epic from Careers adviser.

## 2023-08-11 NOTE — Patient Instructions (Signed)
 SURGICAL WAITING ROOM VISITATION Patients having surgery or a procedure may have no more than 2 support people in the waiting area - these visitors may rotate in the visitor waiting room.   Due to an increase in RSV and influenza rates and associated hospitalizations, children ages 14 and under may not visit patients in [facility] hospitals. If the patient needs to stay at the hospital during part of their recovery, the visitor guidelines for inpatient rooms apply.  PRE-OP VISITATION  Pre-op nurse will coordinate an appropriate time for 1 support person to accompany the patient in pre-op.  This support person may not rotate.  This visitor will be contacted when the time is appropriate for the visitor to come back in the pre-op area.  Please refer to the [facility] website for the visitor guidelines for Inpatients (after your surgery is over and you are in a regular room).  You are not required to quarantine at this time prior to your surgery. However, you must do this: Hand Hygiene often Do NOT share personal items Notify your provider if you are in close contact with someone who has COVID or you develop fever 100.4 or greater, new onset of sneezing, cough, sore throat, shortness of breath or body aches.  If you test positive for Covid or have been in contact with anyone that has tested positive in the last 10 days please notify you surgeon.    Your procedure is scheduled on:  Friday  August 15, 2023  Report to [facility] Main Entrance: [address] entrance where the [employer] is available.   Report to admitting at: 09:00    AM  Call this number if you have any questions or problems the morning of surgery [phone]  DO NOT EAT OR DRINK ANYTHING AFTER MIDNIGHT THE NIGHT PRIOR TO YOUR SURGERY / PROCEDURE.   FOLLOW  ANY ADDITIONAL PRE OP INSTRUCTIONS YOU RECEIVED FROM YOUR SURGEON'S OFFICE!!!   Oral Hygiene is also important to reduce your risk of infection.        Remember -  BRUSH YOUR TEETH THE MORNING OF SURGERY WITH YOUR REGULAR TOOTHPASTE  Do NOT smoke after Midnight the night before surgery.  STOP TAKING all Vitamins, Herbs and supplements 1 week before your surgery.   Take ONLY these medicines the morning of surgery with A SIP OF WATER: sertraline, You may take EITHER Ketorolac OR Percocet if needed for pain. You may use your Flonase and Astelin nasal spray if needed.                    You may not have any metal on your body including hair pins, jewelry, and body piercing  Do not wear make-up, lotions, powders, perfumes or deodorant  Do not wear nail polish including gel and S&S, artificial / acrylic nails, or any other type of covering on natural nails including finger and toenails. If you have artificial nails, gel coating, etc., that needs to be removed by a nail salon, Please have this removed prior to surgery. Not doing so may mean that your surgery could be cancelled or delayed if the Surgeon or anesthesia staff feels like they are unable to monitor you safely.   Do not shave 48 hours prior to surgery to avoid nicks in your skin which may contribute to postoperative infections.   Contacts, Hearing Aids, dentures or bridgework may not be worn into surgery. DENTURES WILL BE REMOVED PRIOR TO SURGERY PLEASE DO NOT APPLY "Poly grip"  OR ADHESIVES!!!  Patients discharged on the day of surgery will not be allowed to drive home.  Someone NEEDS to stay with you for the first 24 hours after anesthesia.  Do not bring your home medications to the hospital. The Pharmacy will dispense medications listed on your medication list to you during your admission in the Hospital.  Please read over the following fact sheets you were given: IF YOU HAVE QUESTIONS ABOUT YOUR PRE-OP INSTRUCTIONS, PLEASE CALL [phone].   [place] - Preparing for Surgery Before surgery, you can play an important role.  Because skin is not sterile, your skin needs to be as free of germs  as possible.  You can reduce the number of germs on your skin by washing with Antibacterial soap before surgery.  . Do not shave (including legs and underarms) for at least 48 hours prior to the first shower.  You may shave your face/neck.  Please follow these instructions carefully:  1.  Shower with antibacterial Soap the night before surgery and the  morning of surgery.  2.  If you choose to wash your hair, wash your hair first as usual with your normal  shampoo.  3.  After you shampoo, rinse your hair and body thoroughly to remove the shampoo.                             4.  You can apply soap directly to the skin and wash.  Gently with a scrungie or clean washcloth.  5.  Wash face,  Genitals (private parts) with your normal soap.             6.  Wash thoroughly, paying special attention to the area where your  surgery  will be performed.  7.  Thoroughly rinse your body with warm water from the neck down.  8.   Pat yourself dry with a clean towel.             9  Wear clean pajamas.            10 Place clean sheets on your bed the night of your first shower and do not  sleep with pets.  ON THE DAY OF SURGERY : Do not apply any lotions/deodorants the morning of surgery.  Please wear clean clothes to the hospital/surgery center.  FAILURE TO FOLLOW THESE INSTRUCTIONS MAY RESULT IN THE CANCELLATION OF YOUR SURGERY  PATIENT SIGNATURE_________________________________  NURSE SIGNATURE__________________________________

## 2023-08-11 NOTE — Progress Notes (Addendum)
 The patient was identified using 2 approved identifiers. All issues noted in this document were discussed and addressed, [name] voiced understanding and agreement with all preoperative instructions. The patient was emailed the surgery instructions per her request.    [email or web address]  The patient was instructed to call our Admitting Office [phone] or [phone]) to complete their Pre-surgical Interview.    Medication Reconciliation was completed on 07-29-23  COVID Vaccine received:  []  No [x]  Yes Date of any COVID positive Test in last 90 days:  PCP - Lori Copas PA-C [phone] (Work)  [phone] (Fax)  Cardiologist - none  Chest x-ray - 04-10-2023  2v  CEW EKG - no history to warrant  Stress Test -  ECHO - 10-23-2021  CEW   Negative scan (Done for fatigue, long haul COVID) Cardiac Cath -   PCR screen: []  Ordered & Completed []   No Order but Needs PROFEND     [x]   N/A for this surgery  Surgery Plan:  [x]  Ambulatory   []  Outpatient in bed  []  Admit Anesthesia:    [x]  General  []  Spinal  []   Choice []   MAC  Bowel Prep - [x]  No  []   Yes ______  Pacemaker / ICD device [x]  No []  Yes   Spinal Cord Stimulator:[x]  No []  Yes       History of Sleep Apnea? [x]  No []  Yes   CPAP used?- [x]  No []  Yes    Does the patient monitor blood sugar?   [x]  N/A   []  No []  Yes  Patient has: [x]  NO Hx DM   []  Pre-DM   []  DM1  []   DM2 Last A1c was:  5.3 normal on 09-28-21      Blood Thinner / Instructions:  none Aspirin Instructions:  none  ERAS Protocol Ordered: [x]  No  []  Yes Patient is to be NPO after:  midnight prior  Dental hx: []  Dentures:  []  N/A      []  Bridge or Partial:                   []  Loose or Damaged teeth:   Comments:   Activity level: Patient is able / unable to climb a flight of stairs without difficulty; []  No CP  []  No SOB, but would have ___   Patient can / can not perform ADLs without assistance.   Anesthesia review: anxiety, seasonal allergies.    Patient denies shortness of breath, fever, cough and chest pain at PAT appointment.  Patient verbalized understanding and agreement to the Pre-Surgical Instructions that were given to them at this PAT appointment. Patient was also educated of the need to review these PAT instructions again prior to her surgery.I reviewed the appropriate phone numbers to call if they have any and questions or concerns.

## 2023-08-13 ENCOUNTER — Encounter (HOSPITAL_COMMUNITY): Payer: Self-pay

## 2023-08-13 ENCOUNTER — Encounter (HOSPITAL_COMMUNITY)
Admission: RE | Admit: 2023-08-13 | Discharge: 2023-08-13 | Disposition: A | Discharge status: Home / Self Care | Payer: PRIVATE HEALTH INSURANCE | Source: Ambulatory Visit | Attending: Urology | Admitting: Urology

## 2023-08-13 DIAGNOSIS — Z01818 Encounter for other preprocedural examination: Secondary | ICD-10-CM

## 2023-08-14 MED ORDER — GENTAMICIN SULFATE 40 MG/ML IJ SOLN
5.0000 mg/kg | INTRAVENOUS | Status: AC
  Administered 2023-08-15: 320 mg via INTRAVENOUS
  Filled 2023-08-14: qty 8

## 2023-08-14 NOTE — H&P (Signed)
 Lori Fisher is a 48 year old female presenting as a hospital follow-up for bilateral renal stones on 07/29/2023. She was found to have a 2 cm partially obstructing stone in the right renal pelvis with mild hydronephrosis and a 4 mm nonobstructing left renal stone. She is scheduled to undergo bilateral ureteroscopy on 08/15/2023 with Dr. Annabell Howells. She was provided oxycodone, ondansetron, and ketorolac as needed by the ED. Her right flank pain pain has been manageable with ketorolac and heating pad. She denies gross hematuria, dysuria, fever/chills, nausea/vomiting.      ALLERGIES: None   MEDICATIONS: Atorvastatin Calcium 10 mg tablet 1 tablet PO Daily  Azelastine Hcl 0.05 % drops  Fiah Oil  Flonase Allergy Relief  Ketorolac Tromethamine 10 mg tablet 1 tablet PO Daily  Levocetirizine Dihydrochloride 5 mg tablet 1 tablet PO Daily  Magnesium  Montelukast Sodium 10 mg tablet 1 tablet PO Daily  Sertraline Hcl     GU PSH: ESWL, Left - 2019     NON-GU PSH: No Non-GU PSH    GU PMH: Renal calculus (Stable), Right, Stable right renal stone without recurrent left renal stones. F/U in 1 year for a KUB. - 2020, - 2019, - 2019, She will need a metabolic w/u when she is over this episode. , - 2019 Flank Pain, She has a 10x64mm left renal stone with intermittent pain. I reviewed the options and will get her set up for ESWL. I sent percocet and tamsulosin. I reviewed the risks of ESWL including bleeding, infection, injury to the kidney or adjacent structures, failure to fragment the stone, need for ancillary procedures, thrombotic events, cardiac arrhythmias and sedation complications. - 2019      PMH Notes: Stomach ulcers  History of kidney stones    NON-GU PMH: Anxiety Depression Diabetes Type 2 Hypercholesterolemia    FAMILY HISTORY: Blood In Urine - Father Colon Cancer - Father kidney disease - Father Kidney Stones - Father   SOCIAL HISTORY: Marital Status: Married Preferred Language: English; Race:  White Current Smoking Status: Patient has never smoked.   Tobacco Use Assessment Completed: Used Tobacco in last 30 days? Does not use smokeless tobacco. Drinks 1 drink per week.  Does not use drugs. Drinks 1 caffeinated drink per day. Has not had a blood transfusion.    REVIEW OF SYSTEMS:    GU Review Female:   Patient reports frequent urination, hard to postpone urination, get up at night to urinate, leakage of urine, stream starts and stops, trouble starting your stream, and have to strain to urinate. Patient denies burning /pain with urination and being pregnant.  Gastrointestinal (Upper):   Patient denies nausea, vomiting, and indigestion/ heartburn.  Gastrointestinal (Lower):   Patient reports diarrhea and constipation.   Constitutional:   Patient reports night sweats and fatigue. Patient denies fever and weight loss.  Skin:   Patient denies skin rash/ lesion and itching.  Eyes:   Patient denies blurred vision and double vision.  Ears/ Nose/ Throat:   Patient denies sore throat and sinus problems.  Hematologic/Lymphatic:   Patient reports swollen glands. Patient denies easy bruising.  Cardiovascular:   Patient denies leg swelling and chest pains.  Respiratory:   Patient denies cough and shortness of breath.  Endocrine:   Patient denies excessive thirst.  Musculoskeletal:   Patient reports back pain. Patient denies joint pain.  Neurological:   Patient denies headaches and dizziness.  Psychologic:   Patient reports depression and anxiety.    VITAL SIGNS:      08/05/2023 11:17  AM  Weight 185 lb / 83.91 kg  Height 62 in / 157.48 cm  BP 118/78 mmHg  Pulse 86 /min  BMI 33.8 kg/m   MULTI-SYSTEM PHYSICAL EXAMINATION:    Constitutional: Well-nourished. No physical deformities. Normally developed. Good grooming.  Neck: Neck symmetrical, not swollen. Normal tracheal position.  Respiratory: No labored breathing, no use of accessory muscles.   Cardiovascular: Normal temperature,  normal extremity pulses, no swelling, no varicosities.  Skin: No paleness, no jaundice, no cyanosis. No lesion, no ulcer, no rash.  Neurologic / Psychiatric: Oriented to time, oriented to place, oriented to person. No depression, no anxiety, no agitation.  Gastrointestinal: No mass, no tenderness, no rigidity, non obese abdomen.  Eyes: Normal conjunctivae. Normal eyelids.  Musculoskeletal: Normal gait and station of head and neck.     Complexity of Data:  Source Of History:  Patient, Medical Record Summary  Lab Test Review:   CBC with Diff, CMP  Records Review:   Previous Doctor Records, Previous Hospital Records, Previous Patient Records  Urine Test Review:   Urinalysis, Urine Culture  X-Ray Review: C.T. Abdomen/Pelvis: Reviewed Films. Reviewed Report. Discussed With Patient. CLINICAL DATA: Abdominal/flank pain. Concern for kidney stone.   EXAM:  CT ABDOMEN AND PELVIS WITHOUT CONTRAST   TECHNIQUE:  Multidetector CT imaging of the abdomen and pelvis was performed  following the standard protocol without IV contrast.   RADIATION DOSE REDUCTION: This exam was performed according to the  departmental dose-optimization program which includes automated  exposure control, adjustment of the mA and/or kV according to  patient size and/or use of iterative reconstruction technique.   COMPARISON: None Available.   FINDINGS:  Evaluation of this exam is limited in the absence of intravenous  contrast.   Lower chest: The visualized lung bases are clear.   No intra-abdominal free air or free fluid.   Hepatobiliary: Fatty liver. No biliary ductal dilatation. The  gallbladder is unremarkable.   Pancreas: Unremarkable. No pancreatic ductal dilatation or  surrounding inflammatory changes.   Spleen: Normal in size without focal abnormality.   Adrenals/Urinary Tract: The adrenal glands unremarkable. There is a  2 cm partially obstructing stone in the right renal pelvis with mild  right  hydronephrosis. There is a 4 mm nonobstructing left renal  inferior pole calculus. There is no hydronephrosis on the left. The  visualized ureters and urinary bladder appear unremarkable.   Stomach/Bowel: There is sigmoid diverticulosis without active  inflammatory changes. There is no bowel obstruction or active  inflammation. The appendix is normal.   Vascular/Lymphatic: The abdominal aorta and IVC are unremarkable on  this noncontrast CT. No portal venous gas. There is no adenopathy.   Reproductive: The uterus is grossly unremarkable. No suspicious  adnexal masses.   Other: None   Musculoskeletal: No acute osseous pathology.   IMPRESSION:  1. A 2 cm partially obstructing stone in the right renal pelvis with  mild right hydronephrosis.  2. A 4 mm nonobstructing left renal inferior pole calculus. No  hydronephrosis on the left.  3. Sigmoid diverticulosis. No bowel obstruction. Normal appendix.  4. Fatty liver.    Electronically Signed  By: Elgie Collard M.D.  On: 07/29/2023 14:54      08/05/23  Urinalysis  Urine Appearance Slightly Cloudy   Urine Color Yellow   Urine Glucose Neg mg/dL  Urine Bilirubin Neg mg/dL  Urine Ketones Neg mg/dL  Urine Specific Gravity 1.025   Urine Blood 3+ ery/uL  Urine pH 5.5   Urine Protein Trace  mg/dL  Urine Urobilinogen 0.2 mg/dL  Urine Nitrites Neg   Urine Leukocyte Esterase Trace leu/uL  Urine WBC/hpf 6 - 10/hpf   Urine RBC/hpf 3 - 10/hpf   Urine Epithelial Cells 0 - 5/hpf   Urine Bacteria Few (10-25/hpf)   Urine Mucous Not Present   Urine Yeast NS (Not Seen)   Urine Trichomonas Not Present   Urine Cystals NS (Not Seen)   Urine Casts NS (Not Seen)   Urine Sperm Not Present    PROCEDURES:          Visit Complexity - G2211          Urinalysis w/Scope Dipstick Dipstick Cont'd Micro  Color: Yellow Bilirubin: Neg mg/dL WBC/hpf: 6 - 91/YNW  Appearance: Slightly Cloudy Ketones: Neg mg/dL RBC/hpf: 3 - 29/FAO  Specific  Gravity: 1.025 Blood: 3+ ery/uL Bacteria: Few (10-25/hpf)  pH: 5.5 Protein: Trace mg/dL Cystals: NS (Not Seen)  Glucose: Neg mg/dL Urobilinogen: 0.2 mg/dL Casts: NS (Not Seen)    Nitrites: Neg Trichomonas: Not Present    Leukocyte Esterase: Trace leu/uL Mucous: Not Present      Epithelial Cells: 0 - 5/hpf      Yeast: NS (Not Seen)      Sperm: Not Present    ASSESSMENT:      ICD-10 Details  1 GU:   Renal calculus - N20.0 Undiagnosed New Problem  2   Ureteral obstruction secondary to calculous - N13.2 Undiagnosed New Problem   PLAN:            Medications New Meds: Ketorolac Tromethamine 10 mg tablet 1 tablet PO Q 6 H PRN   #10  0 Refill(s)  Pharmacy Name:  [id number]  Address:  [address]   [place], [place] [id number]  Phone:  [phone]  Fax:  [phone]            Orders Labs Urine Culture          Schedule Return Visit/Planned Activity: Keep Scheduled Appointment          Document Letter(s):  Created for Patient: Clinical Summary         Notes:   Culture sent. She is scheduled to undergo bilateral ureteroscopy on 08/15/23 with Dr. Annabell Howells. I explained the procedure to her as well as the risks. I answered all questions to the best of my ability. She requested a refill of ketorolac, which I provided. Return precautions discussed including uncontrolled pain or nausea/vomiting, dysuria, fever/chils.         Next Appointment:      Next Appointment: 08/15/2023 11:15 AM    Appointment Type: Surgery     Location: [facility] [phone]    Provider: Bjorn Pippin, M.D.    Reason for Visit: OP--WL--CY, BIL RGP, BIL URS, HLL, STENT

## 2023-08-14 NOTE — Anesthesia Preprocedure Evaluation (Addendum)
 Anesthesia Evaluation  Patient identified by MRN, date of birth, ID band Patient confused    Reviewed: Allergy & Precautions, NPO status , Patient's Chart, lab work & pertinent test results  History of Anesthesia Complications Negative for: history of anesthetic complications  Airway Mallampati: II  TM Distance: >3 FB Neck ROM: Full    Dental no notable dental hx. (+) Teeth Intact, Dental Advisory Given   Pulmonary    Pulmonary exam normal breath sounds clear to auscultation       Cardiovascular (-) hypertension(-) angina (-) Past MI Normal cardiovascular exam Rhythm:Regular Rate:Normal     Neuro/Psych  PSYCHIATRIC DISORDERS Anxiety Depression       GI/Hepatic   Endo/Other    Renal/GU Renal diseaseLab Results      Component                Value               Date                                    K                        3.4 (L)             07/29/2023                CO2                      24                  07/29/2023                BUN                      9                   07/29/2023                CREATININE               0.42 (L)            07/29/2023                        CALCIUM                  8.7 (L)             07/29/2023                     Musculoskeletal   Abdominal   Peds  Hematology Lab Results      Component                Value               Date                      WBC                      8.2                 07/29/2023                HGB  14.1                07/29/2023                HCT                      41.2                07/29/2023                MCV                      88.6                07/29/2023                PLT                      198                 07/29/2023              Anesthesia Other Findings   Reproductive/Obstetrics                             Anesthesia Physical Anesthesia Plan  ASA: 2  Anesthesia Plan:  General   Post-op Pain Management: Tylenol PO (pre-op)*   Induction: Intravenous  PONV Risk Score and Plan: Treatment may vary due to age or medical condition, Midazolam, Ondansetron and Dexamethasone  Airway Management Planned: LMA  Additional Equipment: None  Intra-op Plan:   Post-operative Plan:   Informed Consent: I have reviewed the patients History and Physical, chart, labs and discussed the procedure including the risks, benefits and alternatives for the proposed anesthesia with the patient or authorized representative who has indicated his/her understanding and acceptance.     Dental advisory given  Plan Discussed with: CRNA and Surgeon  Anesthesia Plan Comments:        Anesthesia Quick Evaluation

## 2023-08-15 ENCOUNTER — Ambulatory Visit (HOSPITAL_COMMUNITY): Payer: PRIVATE HEALTH INSURANCE

## 2023-08-15 ENCOUNTER — Ambulatory Visit (HOSPITAL_COMMUNITY): Payer: PRIVATE HEALTH INSURANCE | Admitting: Anesthesiology

## 2023-08-15 ENCOUNTER — Ambulatory Visit (HOSPITAL_COMMUNITY)
Admission: RE | Admit: 2023-08-15 | Discharge: 2023-08-15 | Disposition: A | Discharge status: Home / Self Care | Payer: PRIVATE HEALTH INSURANCE | Source: Ambulatory Visit | Attending: Urology | Admitting: Urology

## 2023-08-15 ENCOUNTER — Encounter (HOSPITAL_COMMUNITY): Payer: Self-pay | Admitting: Urology

## 2023-08-15 ENCOUNTER — Encounter (HOSPITAL_COMMUNITY)
Admission: RE | Disposition: A | Discharge status: Home / Self Care | Payer: Self-pay | Source: Ambulatory Visit | Attending: Urology

## 2023-08-15 ENCOUNTER — Ambulatory Visit (HOSPITAL_BASED_OUTPATIENT_CLINIC_OR_DEPARTMENT_OTHER): Payer: PRIVATE HEALTH INSURANCE | Admitting: Anesthesiology

## 2023-08-15 DIAGNOSIS — F32A Depression, unspecified: Secondary | ICD-10-CM | POA: Diagnosis not present

## 2023-08-15 DIAGNOSIS — F419 Anxiety disorder, unspecified: Secondary | ICD-10-CM | POA: Insufficient documentation

## 2023-08-15 DIAGNOSIS — N132 Hydronephrosis with renal and ureteral calculous obstruction: Secondary | ICD-10-CM | POA: Insufficient documentation

## 2023-08-15 DIAGNOSIS — Z841 Family history of disorders of kidney and ureter: Secondary | ICD-10-CM | POA: Diagnosis not present

## 2023-08-15 DIAGNOSIS — Z01818 Encounter for other preprocedural examination: Secondary | ICD-10-CM

## 2023-08-15 DIAGNOSIS — N200 Calculus of kidney: Secondary | ICD-10-CM

## 2023-08-15 HISTORY — PX: CYSTOSCOPY/URETEROSCOPY/HOLMIUM LASER/STENT PLACEMENT: SHX6546

## 2023-08-15 LAB — POCT PREGNANCY, URINE: Preg Test, Ur: NEGATIVE

## 2023-08-15 SURGERY — CYSTOSCOPY/URETEROSCOPY/HOLMIUM LASER/STENT PLACEMENT
Anesthesia: General | Laterality: Bilateral

## 2023-08-15 MED ORDER — ONDANSETRON HCL 4 MG/2ML IJ SOLN
INTRAMUSCULAR | Status: AC
  Filled 2023-08-15: qty 2

## 2023-08-15 MED ORDER — FENTANYL CITRATE (PF) 100 MCG/2ML IJ SOLN
INTRAMUSCULAR | Status: AC
  Filled 2023-08-15: qty 2

## 2023-08-15 MED ORDER — SODIUM CHLORIDE 0.9 % IR SOLN
Status: DC | PRN
  Administered 2023-08-15: 3000 mL via INTRAVESICAL

## 2023-08-15 MED ORDER — MIDAZOLAM HCL 5 MG/5ML IJ SOLN
INTRAMUSCULAR | Status: DC | PRN
  Administered 2023-08-15: 2 mg via INTRAVENOUS

## 2023-08-15 MED ORDER — ONDANSETRON HCL 4 MG/2ML IJ SOLN
INTRAMUSCULAR | Status: DC | PRN
  Administered 2023-08-15: 4 mg via INTRAVENOUS

## 2023-08-15 MED ORDER — AMISULPRIDE (ANTIEMETIC) 5 MG/2ML IV SOLN: 10.0000 mg | Freq: Once | INTRAVENOUS | Status: DC | PRN

## 2023-08-15 MED ORDER — DEXAMETHASONE SODIUM PHOSPHATE 10 MG/ML IJ SOLN
INTRAMUSCULAR | Status: AC
  Filled 2023-08-15: qty 1

## 2023-08-15 MED ORDER — OXYCODONE-ACETAMINOPHEN 5-325 MG PO TABS: 1.0000 | ORAL_TABLET | Freq: Four times a day (QID) | ORAL | 0 refills | Status: DC | PRN

## 2023-08-15 MED ORDER — CHLORHEXIDINE GLUCONATE 0.12 % MT SOLN
15.0000 mL | Freq: Once | OROMUCOSAL | Status: AC
  Administered 2023-08-15: 15 mL via OROMUCOSAL

## 2023-08-15 MED ORDER — LIDOCAINE HCL (PF) 2 % IJ SOLN
INTRAMUSCULAR | Status: DC | PRN
Start: 2023-08-15 — End: 2023-08-15
  Administered 2023-08-15: 100 mg via INTRADERMAL

## 2023-08-15 MED ORDER — ONDANSETRON HCL 4 MG/2ML IJ SOLN: 4.0000 mg | Freq: Once | INTRAMUSCULAR | Status: DC | PRN

## 2023-08-15 MED ORDER — KETOROLAC TROMETHAMINE 30 MG/ML IJ SOLN
INTRAMUSCULAR | Status: DC | PRN
  Administered 2023-08-15: 30 mg via INTRAVENOUS

## 2023-08-15 MED ORDER — DEXAMETHASONE SODIUM PHOSPHATE 10 MG/ML IJ SOLN
INTRAMUSCULAR | Status: DC | PRN
  Administered 2023-08-15: 10 mg via INTRAVENOUS

## 2023-08-15 MED ORDER — ACETAMINOPHEN 10 MG/ML IV SOLN
INTRAVENOUS | Status: AC
  Filled 2023-08-15: qty 100

## 2023-08-15 MED ORDER — SODIUM CHLORIDE 0.9% FLUSH: 3.0000 mL | Freq: Two times a day (BID) | INTRAVENOUS | Status: DC

## 2023-08-15 MED ORDER — OXYCODONE HCL 5 MG/5ML PO SOLN: 5.0000 mg | Freq: Once | ORAL | Status: DC | PRN

## 2023-08-15 MED ORDER — KETOROLAC TROMETHAMINE 30 MG/ML IJ SOLN
INTRAMUSCULAR | Status: AC
  Filled 2023-08-15: qty 1

## 2023-08-15 MED ORDER — EPHEDRINE SULFATE-NACL 50-0.9 MG/10ML-% IV SOSY
PREFILLED_SYRINGE | INTRAVENOUS | Status: DC | PRN
  Administered 2023-08-15 (×2): 5 mg via INTRAVENOUS

## 2023-08-15 MED ORDER — ACETAMINOPHEN 10 MG/ML IV SOLN
INTRAVENOUS | Status: DC | PRN
  Administered 2023-08-15: 1000 mg via INTRAVENOUS

## 2023-08-15 MED ORDER — LACTATED RINGERS IV SOLN: INTRAVENOUS | Status: DC

## 2023-08-15 MED ORDER — MIDAZOLAM HCL 2 MG/2ML IJ SOLN
INTRAMUSCULAR | Status: AC
  Filled 2023-08-15: qty 2

## 2023-08-15 MED ORDER — SODIUM CHLORIDE 0.9 % IV SOLN
INTRAVENOUS | Status: DC | PRN
  Administered 2023-08-15: 13 mL

## 2023-08-15 MED ORDER — PROPOFOL 10 MG/ML IV BOLUS
INTRAVENOUS | Status: AC
  Filled 2023-08-15: qty 20

## 2023-08-15 MED ORDER — ORAL CARE MOUTH RINSE: 15.0000 mL | Freq: Once | OROMUCOSAL | Status: AC

## 2023-08-15 MED ORDER — PHENYLEPHRINE 80 MCG/ML (10ML) SYRINGE FOR IV PUSH (FOR BLOOD PRESSURE SUPPORT)
PREFILLED_SYRINGE | INTRAVENOUS | Status: DC | PRN
  Administered 2023-08-15 (×2): 160 ug via INTRAVENOUS

## 2023-08-15 MED ORDER — ACETAMINOPHEN 10 MG/ML IV SOLN: 1000.0000 mg | Freq: Once | INTRAVENOUS | Status: DC | PRN

## 2023-08-15 MED ORDER — OXYCODONE HCL 5 MG PO TABS: 5.0000 mg | ORAL_TABLET | Freq: Once | ORAL | Status: DC | PRN

## 2023-08-15 MED ORDER — HYDROMORPHONE HCL 1 MG/ML IJ SOLN: 0.2500 mg | INTRAMUSCULAR | Status: DC | PRN

## 2023-08-15 MED ORDER — LIDOCAINE HCL (PF) 2 % IJ SOLN
INTRAMUSCULAR | Status: AC
  Filled 2023-08-15: qty 5

## 2023-08-15 MED ORDER — FENTANYL CITRATE (PF) 100 MCG/2ML IJ SOLN
INTRAMUSCULAR | Status: DC | PRN
Start: 2023-08-15 — End: 2023-08-15
  Administered 2023-08-15 (×4): 25 ug via INTRAVENOUS

## 2023-08-15 MED ORDER — EPHEDRINE 5 MG/ML INJ
INTRAVENOUS | Status: AC
  Filled 2023-08-15: qty 5

## 2023-08-15 MED ORDER — PROPOFOL 10 MG/ML IV BOLUS
INTRAVENOUS | Status: DC | PRN
  Administered 2023-08-15: 190 mg via INTRAVENOUS

## 2023-08-15 SURGICAL SUPPLY — 21 items
BAG URO CATCHER STRL LF (MISCELLANEOUS) ×1 IMPLANT
BASKET STONE NCOMPASS (UROLOGICAL SUPPLIES) IMPLANT
CATH URETERAL DUAL LUMEN 10F (MISCELLANEOUS) IMPLANT
CATH URETL OPEN 5X70 (CATHETERS) IMPLANT
CLOTH BEACON ORANGE TIMEOUT ST (SAFETY) ×1 IMPLANT
EXTRACTOR STONE NITINOL NGAGE (UROLOGICAL SUPPLIES) IMPLANT
GLOVE SURG SS PI 8.0 STRL IVOR (GLOVE) ×1 IMPLANT
GOWN SPEC L4 XLG W/TWL (GOWN DISPOSABLE) ×1 IMPLANT
GUIDEWIRE STR DUAL SENSOR (WIRE) ×1 IMPLANT
IV NS IRRIG 3000ML ARTHROMATIC (IV SOLUTION) ×1 IMPLANT
KIT TURNOVER KIT A (KITS) IMPLANT
LASER FIB FLEXIVA PULSE ID 365 (Laser) IMPLANT
LASER FIB FLEXIVA PULSE ID 550 (Laser) IMPLANT
LASER FIB FLEXIVA PULSE ID 910 (Laser) IMPLANT
MANIFOLD NEPTUNE II (INSTRUMENTS) ×1 IMPLANT
PACK CYSTO (CUSTOM PROCEDURE TRAY) ×1 IMPLANT
SHEATH NAVIGATOR HD 11/13X36 (SHEATH) IMPLANT
STENT URET 6FRX22 CONTOUR (STENTS) IMPLANT
TRACTIP FLEXIVA PULS ID 200XHI (Laser) IMPLANT
TUBING CONNECTING 10 (TUBING) ×1 IMPLANT
TUBING UROLOGY SET (TUBING) ×1 IMPLANT

## 2023-08-15 NOTE — Transfer of Care (Signed)
 Immediate Anesthesia Transfer of Care Note  Patient: Lori Fisher  Procedure(s) Performed: CYSTOSCOPY/BILATERAL URETEROSCOPY/HOLMIUM LASER/BILATERAL STENT PLACEMENT (Bilateral)  Patient Location: PACU  Anesthesia Type:General  Level of Consciousness: awake  Airway & Oxygen Therapy: Patient Spontanous Breathing and Patient connected to face mask oxygen  Post-op Assessment: Report given to RN and Post -op Vital signs reviewed and stable  Post vital signs: Reviewed and stable  Last Vitals:  Vitals Value Taken Time  BP 122/79 08/15/23 1240  Temp    Pulse 93 08/15/23 1241  Resp 12 08/15/23 1241  SpO2 100 % 08/15/23 1241  Vitals shown include unfiled device data.  Last Pain:  Vitals:   08/15/23 0924  TempSrc:   PainSc: 3       Patients Stated Pain Goal: 0 (08/15/23 0924)  Complications: No notable events documented.

## 2023-08-15 NOTE — Op Note (Signed)
 Procedure: 1.  Cystoscopy with bilateral retrograde pyelograms and interpretation. 2.  Right ureteroscopy with holmium laser application, stone extraction and insertion of right double-J stent without tether. 3.  Left ureteroscopic stone extraction. 4.  Cystoscopy with insertion of left double-J stent. 5.  Application of fluoroscopy.  Preoperative diagnosis: Bilateral renal stones.  Postop diagnosis: Same.  Surgeon: Dr. Bjorn Pippin.  Anesthesia: General.  Specimen: Stone fragments.  Drains: Bilateral 6 French by 22 cm double-J stents, right without tether and left with tether.  EBL: None.  Complications: None.  Indications: The patient is a 48 year old female with a history recurrent urolithiasis who was recently seen in the emergency room and found to have a 2 cm right renal pelvic stone and a 4 mm left lower pole stone she is elected to undergo bilateral ureteroscopy for management.  Procedure: She was taken operating room where she was given gentamicin.  A general anesthetic was induced.  She was placed in lithotomy position and fitted with PAS hose.  Her perineum and genitalia were prepped Betadine solution she was draped in usual sterile fashion.  Cystoscopy was performed using the 21 [place] scope and 30 degree lens.  Examination revealed a normal urethra with the exception of a few fibroepithelial polyps.  The bladder wall was smooth and pale without tumors, stones or inflammation.  Ureteral orifices were unremarkable.  The left ureteral orifice was cannulated with a 5 [place] open-ended catheter and half-strength Omnipaque was instilled.  Left retrograde pyelogram demonstrated a normal caliber ureter with a delicate intrarenal collecting system without filling defect.  The right ureteral orifice was then cannulated with 5 [place] open-ended catheter and half-strength Omnipaque was instilled.  The right retrograde pyelogram demonstrated a normal caliber ureter to the kidney.   There was a filling defect in the renal pelvis consistent with her stone but no significant obstruction or hydronephrosis.  After completion of the right retrograde guidewire was passed to the kidney under fluoroscopic guidance.  The cystoscope was removed and the inner core of an 89 [place] by 36 cm digital access sheath was advanced the kidney under fluoroscopic guidance without difficulty.  This was then followed by the assembled sheath.  The inner core and wire were then removed.  The dual-lumen digital flexible ureteroscope was then advanced to the kidney and the stone was readily visualized.  The 242 m holmium laser fiber was then passed and the Moses laser was set on the dusting settings.  The left pedal set on 0.3 J and 60 Hz was the primary setting use for fragmentation.  The stone broke into manageable fragments quite readily.  Once the stone was adequately fragmented, some of the larger fragments were then removed using an engage basket.  Once all significant fragments had been removed and only dust and small fragments remained, the ureteroscope was removed and a sensor wire was passed back to the kidney under fluoroscopic guidance.  The access sheath was removed.  The cystoscope was then reinserted over the wire and a 6 [place] by 22 cm contour double-J stent without tether was advanced the kidney under fluoroscopic guidance.  The wire was removed, leaving good coil in the kidney and a good coil in the bladder.  A sensor wire was then advanced to the left kidney and the 8 French dual-lumen catheter was advanced to do initial calibration of the ureter.  This passed easily.  This was followed by the 11 [place] inner core of the access sheath and then the assembled sheath.  The inner core and wire were then removed and the digital ureteroscope was then advanced to the kidney.  A thorough inspection of the collecting system demonstrated a few Randall plaques.  The 4 mm lower pole stone was visualized  and was grasped with an engage basket and removed in 2 pieces.  Final inspection demonstrated no residual stone fragments.  Ureteroscope was removed and a sensor wire was then passed to the kidney.  The access sheath was removed and the cystoscope was reinserted over the wire.  A 6 French by 22 cm contour double-J stent with tether was then advanced the kidney under fluoroscopic guidance.  Wire was removed, a good coil in the kidney and a good coil in the bladder.  The bladder was drained and the cystoscope was removed, leaving the stent string exiting urethra.  The string was knotted close to the urethra and then trimmed to an appropriate length before being tucked vaginally.  She was taken down from the lithotomy position, her anesthetic was reversed and she was moved to recovery in stable condition.  There were no complications.  The stone fragments were given to the family to bring to the office for analysis.

## 2023-08-15 NOTE — Discharge Instructions (Addendum)
 You have bilateral stents but the left has a tether that is coming out of the urethra and you can use the tether to remove the stent on Monday morning.  I will have the office set up earlier follow up next week for removal of the right stent.  Bring your stone fragments to the office.

## 2023-08-15 NOTE — Interval H&P Note (Signed)
 History and Physical Interval Note: no change.   08/15/2023 11:09 AM  Lori Fisher  has presented today for surgery, with the diagnosis of BILATERAL RENAL STONES.  The various methods of treatment have been discussed with the patient and family. After consideration of risks, benefits and other options for treatment, the patient has consented to  Procedure(s) with comments: CYSTOSCOPY/BILATERAL URETEROSCOPY/HOLMIUM LASER/BILATERAL STENT PLACEMENT (Bilateral) - 90 MINUTE CASE as a surgical intervention.  The patient's history has been reviewed, patient examined, no change in status, stable for surgery.  I have reviewed the patient's chart and labs.  Questions were answered to the patient's satisfaction.     Bjorn Pippin

## 2023-08-15 NOTE — Anesthesia Procedure Notes (Signed)
 Procedure Name: LMA Insertion Date/Time: 08/15/2023 11:27 AM  Performed by: Florene Route, CRNAPatient Re-evaluated:Patient Re-evaluated prior to induction Oxygen Delivery Method: Circle system utilized Preoxygenation: Pre-oxygenation with 100% oxygen Induction Type: IV induction LMA: LMA inserted LMA Size: 4.0 Number of attempts: 1 Placement Confirmation: positive ETCO2 and breath sounds checked- equal and bilateral Tube secured with: Tape Dental Injury: Teeth and Oropharynx as per pre-operative assessment

## 2023-08-15 NOTE — Anesthesia Postprocedure Evaluation (Signed)
 Anesthesia Post Note  Patient: Lori Fisher  Procedure(s) Performed: CYSTOSCOPY/BILATERAL URETEROSCOPY/HOLMIUM LASER/BILATERAL STENT PLACEMENT (Bilateral)     Patient location during evaluation: PACU Anesthesia Type: General Level of consciousness: awake and alert Pain management: pain level controlled Vital Signs Assessment: post-procedure vital signs reviewed and stable Respiratory status: spontaneous breathing, nonlabored ventilation, respiratory function stable and patient connected to nasal cannula oxygen Cardiovascular status: blood pressure returned to baseline and stable Postop Assessment: no apparent nausea or vomiting Anesthetic complications: no  No notable events documented.  Last Vitals:  Vitals:   08/15/23 1324 08/15/23 1330  BP: 132/72 122/68  Pulse: 88 100  Resp: 18 20  Temp: (!) 36.4 C   SpO2: 97% 98%    Last Pain:  Vitals:   08/15/23 1324  TempSrc: Oral  PainSc: 0-No pain                 Trevor Iha

## 2023-08-16 ENCOUNTER — Encounter (HOSPITAL_COMMUNITY): Payer: Self-pay | Admitting: Urology

## 2024-04-03 ENCOUNTER — Other Ambulatory Visit (HOSPITAL_COMMUNITY): Payer: Self-pay

## 2024-04-03 MED ORDER — FLUZONE 0.5 ML IM SUSY
0.5000 mL | PREFILLED_SYRINGE | Freq: Once | INTRAMUSCULAR | 0 refills | Status: AC
  Filled 2024-04-03: qty 0.5, 1d supply, fill #0

## 2024-05-20 ENCOUNTER — Encounter: Payer: Self-pay | Admitting: Nurse Practitioner

## 2024-05-20 ENCOUNTER — Other Ambulatory Visit (HOSPITAL_COMMUNITY): Payer: Self-pay

## 2024-05-20 ENCOUNTER — Other Ambulatory Visit: Payer: Self-pay

## 2024-05-20 ENCOUNTER — Ambulatory Visit: Payer: PRIVATE HEALTH INSURANCE | Admitting: Nurse Practitioner

## 2024-05-20 VITALS — BP 110/70 | HR 86 | Temp 99.2°F | Ht 63.0 in | Wt 199.4 lb

## 2024-05-20 DIAGNOSIS — M545 Low back pain, unspecified: Secondary | ICD-10-CM | POA: Diagnosis not present

## 2024-05-20 DIAGNOSIS — U099 Post covid-19 condition, unspecified: Secondary | ICD-10-CM | POA: Diagnosis not present

## 2024-05-20 DIAGNOSIS — F32A Depression, unspecified: Secondary | ICD-10-CM | POA: Diagnosis not present

## 2024-05-20 DIAGNOSIS — J3089 Other allergic rhinitis: Secondary | ICD-10-CM | POA: Diagnosis not present

## 2024-05-20 DIAGNOSIS — F419 Anxiety disorder, unspecified: Secondary | ICD-10-CM | POA: Diagnosis not present

## 2024-05-20 DIAGNOSIS — K582 Mixed irritable bowel syndrome: Secondary | ICD-10-CM | POA: Diagnosis not present

## 2024-05-20 DIAGNOSIS — Z0184 Encounter for antibody response examination: Secondary | ICD-10-CM | POA: Diagnosis not present

## 2024-05-20 DIAGNOSIS — E7800 Pure hypercholesterolemia, unspecified: Secondary | ICD-10-CM

## 2024-05-20 DIAGNOSIS — E559 Vitamin D deficiency, unspecified: Secondary | ICD-10-CM

## 2024-05-20 DIAGNOSIS — G8929 Other chronic pain: Secondary | ICD-10-CM | POA: Diagnosis not present

## 2024-05-20 LAB — VITAMIN D 25 HYDROXY (VIT D DEFICIENCY, FRACTURES): VITD: 35.48 ng/mL (ref 30.00–100.00)

## 2024-05-20 LAB — CBC WITH DIFFERENTIAL/PLATELET
Basophils Absolute: 0 K/uL (ref 0.0–0.1)
Basophils Relative: 0.5 % (ref 0.0–3.0)
Eosinophils Absolute: 0.1 K/uL (ref 0.0–0.7)
Eosinophils Relative: 2 % (ref 0.0–5.0)
HCT: 39.4 % (ref 36.0–46.0)
Hemoglobin: 13.6 g/dL (ref 12.0–15.0)
Lymphocytes Relative: 39.1 % (ref 12.0–46.0)
Lymphs Abs: 2.8 K/uL (ref 0.7–4.0)
MCHC: 34.5 g/dL (ref 30.0–36.0)
MCV: 89.1 fl (ref 78.0–100.0)
Monocytes Absolute: 0.4 K/uL (ref 0.1–1.0)
Monocytes Relative: 4.9 % (ref 3.0–12.0)
Neutro Abs: 3.9 K/uL (ref 1.4–7.7)
Neutrophils Relative %: 53.5 % (ref 43.0–77.0)
Platelets: 221 K/uL (ref 150.0–400.0)
RBC: 4.43 Mil/uL (ref 3.87–5.11)
RDW: 13 % (ref 11.5–15.5)
WBC: 7.2 K/uL (ref 4.0–10.5)

## 2024-05-20 LAB — COMPREHENSIVE METABOLIC PANEL WITH GFR
ALT: 43 U/L — ABNORMAL HIGH (ref 0–35)
AST: 27 U/L (ref 0–37)
Albumin: 4.4 g/dL (ref 3.5–5.2)
Alkaline Phosphatase: 124 U/L — ABNORMAL HIGH (ref 39–117)
BUN: 12 mg/dL (ref 6–23)
CO2: 30 meq/L (ref 19–32)
Calcium: 9.3 mg/dL (ref 8.4–10.5)
Chloride: 103 meq/L (ref 96–112)
Creatinine, Ser: 0.63 mg/dL (ref 0.40–1.20)
GFR: 104.28 mL/min (ref >60.00)
Glucose, Bld: 111 mg/dL — ABNORMAL HIGH (ref 70–99)
Potassium: 3.6 meq/L (ref 3.5–5.1)
Sodium: 141 meq/L (ref 135–145)
Total Bilirubin: 0.4 mg/dL (ref 0.2–1.2)
Total Protein: 6.7 g/dL (ref 6.0–8.3)

## 2024-05-20 LAB — LIPID PANEL
Cholesterol: 165 mg/dL (ref 0–200)
HDL: 44.2 mg/dL (ref >39.00)
LDL Cholesterol: 68 mg/dL (ref 0–99)
NonHDL: 120.62
Total CHOL/HDL Ratio: 4
Triglycerides: 261 mg/dL — ABNORMAL HIGH (ref 0.0–149.0)
VLDL: 52.2 mg/dL — ABNORMAL HIGH (ref 0.0–40.0)

## 2024-05-20 LAB — MAGNESIUM: Magnesium: 1.4 mg/dL — ABNORMAL LOW (ref 1.5–2.5)

## 2024-05-20 MED ORDER — MONTELUKAST SODIUM 10 MG PO TABS
10.0000 mg | ORAL_TABLET | Freq: Every day | ORAL | 3 refills | Status: AC
  Filled 2024-05-20: qty 90, 90d supply, fill #0

## 2024-05-20 MED ORDER — ATORVASTATIN CALCIUM 10 MG PO TABS
10.0000 mg | ORAL_TABLET | Freq: Every day | ORAL | 3 refills | Status: AC
  Filled 2024-05-20: qty 90, 90d supply, fill #0

## 2024-05-20 MED ORDER — FLUTICASONE PROPIONATE 50 MCG/ACT NA SUSP
2.0000 | Freq: Every day | NASAL | 6 refills | Status: AC
  Filled 2024-05-20: qty 16, 30d supply, fill #0
  Filled 2024-06-15: qty 16, 30d supply, fill #1

## 2024-05-20 MED ORDER — LEVOCETIRIZINE DIHYDROCHLORIDE 5 MG PO TABS
5.0000 mg | ORAL_TABLET | Freq: Every evening | ORAL | 3 refills | Status: AC
  Filled 2024-05-20: qty 90, 90d supply, fill #0

## 2024-05-20 MED ORDER — SERTRALINE HCL 100 MG PO TABS
100.0000 mg | ORAL_TABLET | Freq: Every day | ORAL | 3 refills | Status: AC
  Filled 2024-05-20: qty 90, 90d supply, fill #0

## 2024-05-20 NOTE — Progress Notes (Unsigned)
 New Patient Visit  BP 110/70 (BP Location: Left Arm, Patient Position: Sitting, Cuff Size: Normal)   Pulse 86   Temp 99.2 F (37.3 C) (Oral)   Ht 5' 3 (1.6 m)   Wt 199 lb 6.4 oz (90.4 kg)   LMP 03/24/2024 (Approximate)   SpO2 98%   BMI 35.32 kg/m    Subjective:    Patient ID: Lori Fisher, female    DOB: 1976-05-14, 48 y.o.   MRN: [id number]  CC: Chief Complaint  Patient presents with   Establish Care    NP. Est. Care, Rx refills, request work to check titers    HPI: Lori Fisher is a 48 y.o. female presents  Past Medical History:  Diagnosis Date   Allergy    Anxiety    Depression    History of kidney stones    Hyperlipidemia    Tennis elbow     Past Surgical History:  Procedure Laterality Date   CYSTOSCOPY/URETEROSCOPY/HOLMIUM LASER/STENT PLACEMENT Bilateral 08/15/2023   Procedure: CYSTOSCOPY/BILATERAL URETEROSCOPY/HOLMIUM LASER/BILATERAL STENT PLACEMENT;  Surgeon: Watt Rush, MD;  Location: [facility];  Service: Urology;  Laterality: Bilateral;  90 MINUTE CASE   EXTRACORPOREAL SHOCK WAVE LITHOTRIPSY Left 04/16/2018   Procedure: EXTRACORPOREAL SHOCK WAVE LITHOTRIPSY (ESWL);  Surgeon: Cam Morene ORN, MD;  Location: [facility];  Service: Urology;  Laterality: Left;   KIDNEY STONE SURGERY     LITHOTRIPSY  2017   done in [place]   WISDOM TOOTH EXTRACTION      Family History  Problem Relation Age of Onset   Colon cancer Father    Colon polyps Father    Diabetes Sister    Hyperlipidemia Mother    Esophageal cancer Neg Hx    Rectal cancer Neg Hx    Prostate cancer Neg Hx      Social History[1]  Medications Ordered Prior to Encounter[2]   Review of Systems  Constitutional:  Positive for fatigue. Negative for fever.  HENT: Negative.    Eyes: Negative.   Respiratory: Negative.    Cardiovascular: Negative.   Gastrointestinal: Negative.   Endocrine: Negative.   Genitourinary: Negative.   Musculoskeletal:  Positive for arthralgias and back pain.  Skin:  Negative.   Neurological:  Positive for dizziness (at times). Negative for headaches.  Psychiatric/Behavioral: Negative.          Objective:    BP 110/70 (BP Location: Left Arm, Patient Position: Sitting, Cuff Size: Normal)   Pulse 86   Temp 99.2 F (37.3 C) (Oral)   Ht 5' 3 (1.6 m)   Wt 199 lb 6.4 oz (90.4 kg)   LMP 03/24/2024 (Approximate)   SpO2 98%   BMI 35.32 kg/m   Wt Readings from Last 3 Encounters:  05/20/24 199 lb 6.4 oz (90.4 kg)  08/14/23 183 lb (83 kg)  07/29/23 182 lb 15.7 oz (83 kg)    BP Readings from Last 3 Encounters:  05/20/24 110/70  08/15/23 122/68  07/29/23 130/84    Physical Exam     Assessment & Plan:   Problem List Items Addressed This Visit   None    Follow up plan: No follow-ups on file.  Jaquese Irving A Raylene Carmickle    [1]  Social History Tobacco Use   Smoking status: Never   Smokeless tobacco: Never  Vaping Use   Vaping status: Never Used  Substance Use Topics   Alcohol use: Yes    Alcohol/week: 1.0 standard drink of alcohol    Types: 1 Glasses of wine per  week   Drug use: No  [2]  Current Outpatient Medications on File Prior to Visit  Medication Sig Dispense Refill   atorvastatin (LIPITOR) 10 MG tablet Take 10 mg by mouth daily.     azelastine  (ASTELIN ) 0.1 % nasal spray PLACE 1 SPRAY INTO BOTH NOSTRILS DAILY AS DIRECTED (Patient taking differently: Place 1 spray into both nostrils daily.) 30 mL 5   Cholecalciferol (VITAMIN D3) 125 MCG (5000 UT) TABS Take 1 tablet by mouth daily.     fluticasone  (FLONASE ) 50 MCG/ACT nasal spray Place 2 sprays into both nostrils daily. 16 g 6   levocetirizine (XYZAL ) 5 MG tablet TAKE 1 TABLET BY MOUTH EVERY EVENING. (Patient taking differently: Take 5 mg by mouth every evening.) 90 tablet 3   MAGNESIUM GLYCINATE PO Take 400 mg by mouth daily.     montelukast  (SINGULAIR ) 10 MG tablet Take 1 tablet (10 mg total) by mouth at bedtime. 90 tablet 3   Multiple Vitamin (MULTIVITAMIN PO) Take by mouth daily.      Omega-3 Fatty Acids (FISH OIL PO) Take by mouth daily.     sertraline  (ZOLOFT ) 100 MG tablet TAKE 1 TABLET (100 MG TOTAL) BY MOUTH DAILY. 90 tablet 3   acetaminophen  (TYLENOL ) 500 MG tablet Take 1,000 mg by mouth every 6 (six) hours as needed.     dicyclomine (BENTYL) 10 MG capsule Take 10 mg by mouth every 6 (six) hours as needed for spasms. (Patient not taking: Reported on 07/29/2023)     ibuprofen  (ADVIL ) 800 MG tablet Take 1 tablet (800 mg total) by mouth every 8 (eight) hours as needed. (Patient taking differently: Take 800 mg by mouth every 8 (eight) hours as needed for moderate pain (pain score 4-6).) 30 tablet 0   ketorolac  (TORADOL ) 10 MG tablet Take 1 tablet (10 mg total) by mouth every 6 (six) hours as needed. 20 tablet 0   ondansetron  (ZOFRAN -ODT) 8 MG disintegrating tablet Take 1 tablet (8 mg total) by mouth every 8 (eight) hours as needed for nausea. 20 tablet 0   oxyCODONE -acetaminophen  (PERCOCET/ROXICET) 5-325 MG tablet Take 1 tablet by mouth every 6 (six) hours as needed for severe pain (pain score 7-10). 12 tablet 0   No current facility-administered medications on file prior to visit.

## 2024-05-20 NOTE — Patient Instructions (Signed)
 It was great to see you!  We are checking your labs today and will let you know the results via mychart/phone.   Start the back exercises daily   Try an estroven supplement to help with low libido   Let's follow-up in 3-6 months, sooner if you have concerns.  If a referral was placed today, you will be contacted for an appointment. Please note that routine referrals can sometimes take up to 3-4 weeks to process. Please call our office if you haven't heard anything after this time frame.  Take care,  Tinnie Harada, NP

## 2024-05-21 ENCOUNTER — Other Ambulatory Visit: Payer: Self-pay

## 2024-05-21 DIAGNOSIS — K582 Mixed irritable bowel syndrome: Secondary | ICD-10-CM | POA: Insufficient documentation

## 2024-05-21 NOTE — Assessment & Plan Note (Signed)
 Managed with IBS Guard, effective without side effects. Continue IBS Guard as needed.

## 2024-05-21 NOTE — Assessment & Plan Note (Signed)
 Managed with vitamin D  supplementation. Check vitamin D  levels and adjust regimen based on resutlts.

## 2024-05-21 NOTE — Assessment & Plan Note (Signed)
 Managed with Singulair , azelastine  nasal spray, and Flonase . Continue current allergy medications.

## 2024-05-21 NOTE — Assessment & Plan Note (Signed)
 Managed with magnesium glycinate due to gastrointestinal tolerance. Continue magnesium glycinate and check magnesium levels today.

## 2024-05-21 NOTE — Assessment & Plan Note (Signed)
 Chronic pain in hips, lower back, and right knee, worsened by activity and movement. Provided stretches for back pain and continue monthly massages.

## 2024-05-21 NOTE — Assessment & Plan Note (Signed)
 Symptoms of fluctuating temperature, fatigue, dizziness, and brain fog persist since COVID-2023 infection, worsening with subsequent illnesses. Monitor symptoms and manage conservatively.

## 2024-05-21 NOTE — Assessment & Plan Note (Signed)
 Managed with atorvastatin 10 mg. Continue atorvastatin 10 mg. Check CMP, CBC, lipid panel today.

## 2024-05-21 NOTE — Assessment & Plan Note (Signed)
 Chronic, stable. Mood stable on sertraline  despite reduced dose. Low libido possibly linked to sertraline  and perimenopause. Continue sertraline  100mg  daily. Can discuss switching SSRI if still having low libido.

## 2024-05-22 LAB — HEPATITIS B SURFACE ANTIBODY, QUANTITATIVE: Hep B S AB Quant (Post): 43 m[IU]/mL (ref >=10)

## 2024-05-22 LAB — MEASLES/MUMPS/RUBELLA IMMUNITY
Mumps IgG: <9 [AU]/ml — ABNORMAL LOW
Rubella: 3.4 {index}
Rubeola IgG: 37.3 [AU]/ml

## 2024-05-24 ENCOUNTER — Ambulatory Visit: Payer: Self-pay | Admitting: Nurse Practitioner

## 2024-06-15 ENCOUNTER — Other Ambulatory Visit (HOSPITAL_COMMUNITY): Payer: Self-pay

## 2024-07-08 ENCOUNTER — Encounter: Payer: Self-pay | Admitting: Gastroenterology

## 2024-08-18 ENCOUNTER — Encounter: Admitting: Nurse Practitioner
